# Patient Record
Sex: Male | Born: 1970
Health system: Southern US, Community
[De-identification: ages and names within clinical notes are randomized; demographics above are authoritative.]

## PROBLEM LIST (undated history)

## (undated) DIAGNOSIS — E119 Type 2 diabetes mellitus without complications: Secondary | ICD-10-CM

## (undated) DIAGNOSIS — I10 Essential (primary) hypertension: Secondary | ICD-10-CM

## (undated) DIAGNOSIS — R1013 Epigastric pain: Secondary | ICD-10-CM

## (undated) DIAGNOSIS — G4730 Sleep apnea, unspecified: Secondary | ICD-10-CM

## (undated) DIAGNOSIS — E785 Hyperlipidemia, unspecified: Secondary | ICD-10-CM

## (undated) DIAGNOSIS — E039 Hypothyroidism, unspecified: Secondary | ICD-10-CM

## (undated) DIAGNOSIS — M109 Gout, unspecified: Secondary | ICD-10-CM

## (undated) DIAGNOSIS — R51 Headache: Secondary | ICD-10-CM

## (undated) DIAGNOSIS — R519 Headache, unspecified: Secondary | ICD-10-CM

## (undated) DIAGNOSIS — K625 Hemorrhage of anus and rectum: Secondary | ICD-10-CM

## (undated) HISTORY — PX: UPPER GASTROINTESTINAL ENDOSCOPY: SHX188

## (undated) HISTORY — DX: Essential (primary) hypertension: I10

## (undated) HISTORY — DX: Hyperlipidemia, unspecified: E78.5

## (undated) HISTORY — PX: COLONOSCOPY: SHX174

## (undated) HISTORY — DX: Hypothyroidism, unspecified: E03.9

## (undated) HISTORY — DX: Hemorrhage of anus and rectum: K62.5

## (undated) HISTORY — DX: Headache, unspecified: R51.9

## (undated) HISTORY — DX: Gout, unspecified: M10.9

## (undated) HISTORY — PX: OTHER SURGICAL HISTORY: SHX169

## (undated) HISTORY — DX: Type 2 diabetes mellitus without complications: E11.9

## (undated) HISTORY — DX: Sleep apnea, unspecified: G47.30

## (undated) HISTORY — DX: Headache: R51

## (undated) HISTORY — DX: Epigastric pain: R10.13

---

## 2007-07-08 ENCOUNTER — Ambulatory Visit: Payer: Self-pay | Admitting: Cardiology

## 2007-07-26 ENCOUNTER — Ambulatory Visit: Payer: Self-pay

## 2007-08-12 ENCOUNTER — Ambulatory Visit (HOSPITAL_COMMUNITY): Admission: RE | Admit: 2007-08-12 | Discharge: 2007-08-12 | Payer: Self-pay | Admitting: Orthopaedic Surgery

## 2007-08-16 ENCOUNTER — Ambulatory Visit: Payer: Self-pay | Admitting: Cardiology

## 2007-08-18 ENCOUNTER — Ambulatory Visit (HOSPITAL_COMMUNITY): Admission: RE | Admit: 2007-08-18 | Discharge: 2007-08-18 | Payer: Self-pay | Admitting: Cardiology

## 2009-06-15 ENCOUNTER — Ambulatory Visit (HOSPITAL_COMMUNITY): Admission: RE | Admit: 2009-06-15 | Discharge: 2009-06-15 | Payer: Self-pay | Admitting: Orthopaedic Surgery

## 2011-05-12 NOTE — Assessment & Plan Note (Signed)
HEALTHCARE                            CARDIOLOGY OFFICE NOTE   NAME:[name], [name]                        MRN:          [id number]  DATE:07/08/2007                            DOB:          24-Apr-1971    CLINIC NOTE   I am asked to consult on Dr. Corliss [name] for an abnormal electrocardiogram.   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  Of note, he exercises occasionally for  approximately 30 minutes on an elliptical trainer.  He denies any  dyspnea on exertion, orthopnea, PND, pedal edema, palpitations, pre-  syncope, syncope, or exertional chest pain.  He was recently seen by Dr.  Waynard [name] for a routine physical examination.  At that time, he was noted  to have an abnormal electrocardiogram and cardiology was asked to  further evaluate.   MEDICATIONS:  Fish oil daily.  He also takes Maxalt, ibuprofen, and  Zyrtec as needed.   He has no known drug allergies.   SOCIAL HISTORY:  He does not smoke and only rarely consumes alcohol.   FAMILY HISTORY:  Negative for coronary artery disease or sudden death.   PAST MEDICAL HISTORY:  There is no diabetes mellitus.  He apparently has  had borderline hypertension in the past and he has hyperlipidemia (he  did bring laboratories today from March of 2007 that showed a total  cholesterol of 217, an HDL of 33, and an LDL of 152).  He has had  headaches since an early age.  He also has seasonal allergies.  He has  had no previous surgeries.   REVIEW OF SYSTEMS:  He does not have a headache at present.  There are  no fevers or chills.  No productive cough.  There is no hemoptysis.  He  does occasionally have allergies and a cough in the morning.  There is  no melena or hematochezia.  There is no dysuria or hematuria.  There is  no rash or seizure activity.  There is no orthopnea, PND, or pedal  edema.  The remaining systems are negative.   PHYSICAL EXAM:  Blood  pressure 142/98, pulse 64.  He weighs 166 pounds.  He is well-developed, well-nourished in no acute distress.  SKIN:  Warm and dry.  He does not appear to be depressed and there is no peripheral clubbing.  His back is normal.  HEENT:  Normal with normal eyelids.  NECK:  Supple with a normal upstroke bilaterally and there are no bruits  noted.  There is no jugular venous distension and no thyromegaly is  noted.  CHEST:  Clear to auscultation with normal expansion.  CARDIOVASCULAR:  Regular rate and rhythm with normal S1, S2.  There are  no murmurs, rubs, or gallops noted.  There is no change with Valsalva.  His PMI is not displaced.  ABDOMEN:  Nontender, nondistended.  Positive bowel sounds.  No  hepatosplenomegaly.  No mass appreciated.  There is no abdominal bruit.  He has 2+ femoral pulses bilaterally.  No bruits.  EXTREMITIES:  No edema and I  can palpate no cords.  He has 2+ posterior  tibial pulses bilaterally.  NEUROLOGIC:  Grossly intact.   I do have an electrocardiogram from [name] [name] office.  This shows a  normal sinus rhythm at a rate of 71.  The axis is normal.  There are  minor nonspecific ST changes in the inferior leads.  His intervals are  normal.   DIAGNOSES:  1. 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  He      does exercise occasionally and has no symptoms with exercise.  I      have reviewed the electrocardiogram and it shows minor nonspecific      changes.  Given his risk factors of hypertension and      hyperlipidemia, we will plan to proceed with an exercise treadmill.      If it is unremarkable, then we will not proceed with further      cardiac workup.  2. Hypertension - His blood pressure is elevated today and he states      it was elevated when he was seen previously by Dr. Waynard [name].  This      will need to be followed closely and medications will need to be       initiated if lifestyle modifications do not help with this,      including diet and exercise.  3. Hyperlipidemia - He is adjusting his diet.  He also states that Dr.      Waynard [name] has placed him on Crestor.  He will continue on his fish      oil.   We will see him back for his exercise treadmill on July 29.     [name] Frieze Jens Som, MD, [facility]  Electronically Signed    BSC/MedQ  DD: 07/08/2007  DT: 07/08/2007  Job #: [id number]

## 2011-09-17 ENCOUNTER — Other Ambulatory Visit: Payer: Self-pay | Admitting: Internal Medicine

## 2011-09-17 DIAGNOSIS — R1013 Epigastric pain: Secondary | ICD-10-CM

## 2011-09-29 ENCOUNTER — Ambulatory Visit
Admission: RE | Admit: 2011-09-29 | Discharge: 2011-09-29 | Disposition: A | Discharge status: Home / Self Care | Payer: BC Managed Care – PPO | Source: Ambulatory Visit | Attending: Internal Medicine | Admitting: Internal Medicine

## 2011-09-29 DIAGNOSIS — R1013 Epigastric pain: Secondary | ICD-10-CM

## 2011-12-16 ENCOUNTER — Other Ambulatory Visit (INDEPENDENT_AMBULATORY_CARE_PROVIDER_SITE_OTHER): Payer: Self-pay | Admitting: General Surgery

## 2011-12-16 DIAGNOSIS — D225 Melanocytic nevi of trunk: Secondary | ICD-10-CM

## 2017-09-27 DIAGNOSIS — B9681 Helicobacter pylori [H. pylori] as the cause of diseases classified elsewhere: Secondary | ICD-10-CM

## 2017-09-27 HISTORY — DX: Helicobacter pylori (H. pylori) as the cause of diseases classified elsewhere: B96.81

## 2017-10-18 ENCOUNTER — Telehealth: Payer: Self-pay | Admitting: Gastroenterology

## 2017-10-18 DIAGNOSIS — R109 Unspecified abdominal pain: Secondary | ICD-10-CM

## 2017-10-18 NOTE — Telephone Encounter (Signed)
[name] [name]) has had some gnawing epigastric pain for about a month and today passed 2 dark stools.  He otherwise feels fine.  No more fatigue than his usual.  He rarely takes nsaids.  He started zegeride tonight. Planning to take two pills tonight, then one pill BID.  Patty, He needs cbc, bmet tomorrow (tuesday).  CAn you also put him in [facility] Wednesday 9am spot for EGD.  He is expecting a call about the details tomorrow and said you can leave a message on VM and he'll call back if he can't answer right away.  His cell is [phone].  thanks

## 2017-10-19 ENCOUNTER — Other Ambulatory Visit (INDEPENDENT_AMBULATORY_CARE_PROVIDER_SITE_OTHER): Payer: Self-pay

## 2017-10-19 DIAGNOSIS — R109 Unspecified abdominal pain: Secondary | ICD-10-CM

## 2017-10-19 LAB — BASIC METABOLIC PANEL
BUN: 22 mg/dL (ref 6–23)
CO2: 35 mEq/L — ABNORMAL HIGH (ref 19–32)
Calcium: 9.5 mg/dL (ref 8.4–10.5)
Chloride: 97 mEq/L (ref 96–112)
Creatinine, Ser: 1.42 mg/dL (ref 0.40–1.50)
GFR: 56.55 mL/min — AB (ref >60.00)
GLUCOSE: 143 mg/dL — AB (ref 70–99)
POTASSIUM: 3.2 meq/L — AB (ref 3.5–5.1)
Sodium: 139 mEq/L (ref 135–145)

## 2017-10-19 LAB — CBC WITH DIFFERENTIAL/PLATELET
BASOS PCT: 0.5 % (ref 0.0–3.0)
Basophils Absolute: 0 10*3/uL (ref 0.0–0.1)
EOS ABS: 0.1 10*3/uL (ref 0.0–0.7)
EOS PCT: 0.9 % (ref 0.0–5.0)
HEMATOCRIT: 41.6 % (ref 39.0–52.0)
HEMOGLOBIN: 14.3 g/dL (ref 13.0–17.0)
LYMPHS PCT: 19.5 % (ref 12.0–46.0)
Lymphs Abs: 1.5 10*3/uL (ref 0.7–4.0)
MCHC: 34.3 g/dL (ref 30.0–36.0)
MCV: 86.1 fl (ref 78.0–100.0)
Monocytes Absolute: 0.4 10*3/uL (ref 0.1–1.0)
Monocytes Relative: 5 % (ref 3.0–12.0)
Neutro Abs: 5.8 10*3/uL (ref 1.4–7.7)
Neutrophils Relative %: 74.1 % (ref 43.0–77.0)
Platelets: 258 10*3/uL (ref 150.0–400.0)
RBC: 4.84 Mil/uL (ref 4.22–5.81)
RDW: 13.9 % (ref 11.5–15.5)
WBC: 7.8 10*3/uL (ref 4.0–10.5)

## 2017-10-19 NOTE — Telephone Encounter (Signed)
Left message on machine to call back to notify Dr Georgette Dover of the EGD and labs

## 2017-10-20 ENCOUNTER — Ambulatory Visit (AMBULATORY_SURGERY_CENTER): Payer: 59 | Admitting: Gastroenterology

## 2017-10-20 ENCOUNTER — Encounter: Payer: Self-pay | Admitting: Gastroenterology

## 2017-10-20 ENCOUNTER — Other Ambulatory Visit: Payer: Self-pay

## 2017-10-20 VITALS — BP 113/73 | HR 63 | Temp 96.2°F | Resp 16 | Ht 68.0 in | Wt 175.0 lb

## 2017-10-20 DIAGNOSIS — K921 Melena: Secondary | ICD-10-CM

## 2017-10-20 DIAGNOSIS — K2970 Gastritis, unspecified, without bleeding: Secondary | ICD-10-CM | POA: Diagnosis not present

## 2017-10-20 DIAGNOSIS — K2990 Gastroduodenitis, unspecified, without bleeding: Secondary | ICD-10-CM | POA: Diagnosis not present

## 2017-10-20 DIAGNOSIS — K317 Polyp of stomach and duodenum: Secondary | ICD-10-CM | POA: Diagnosis not present

## 2017-10-20 DIAGNOSIS — R1013 Epigastric pain: Secondary | ICD-10-CM

## 2017-10-20 DIAGNOSIS — B9681 Helicobacter pylori [H. pylori] as the cause of diseases classified elsewhere: Secondary | ICD-10-CM | POA: Diagnosis not present

## 2017-10-20 DIAGNOSIS — K2950 Unspecified chronic gastritis without bleeding: Secondary | ICD-10-CM | POA: Diagnosis not present

## 2017-10-20 MED ORDER — SODIUM CHLORIDE 0.9 % IV SOLN: 500.0000 mL | INTRAVENOUS | Status: DC

## 2017-10-20 MED ORDER — NA SULFATE-K SULFATE-MG SULF 17.5-3.13-1.6 GM/177ML PO SOLN: 1.0000 | Freq: Once | ORAL | 0 refills | Status: AC

## 2017-10-20 NOTE — Op Note (Signed)
[facility] [place] Patient Name: [name] [name] Procedure Date: 10/20/2017 8:42 AM MRN: [id number] Endoscopist: Milus Banister , MD Age: 46 Referring MD:  Date of Birth: [birth year] Gender: Male Account #: [id number] Procedure:                Upper GI endoscopy Indications:              Epigastric abdominal pain (2 months, gnawing), 2                            dark stools Medicines:                Monitored Anesthesia Care Procedure:                Pre-Anesthesia Assessment:                           - Prior to the procedure, a History and Physical                            was performed, and patient medications and                            allergies were reviewed. The patient's tolerance of                            previous anesthesia was also reviewed. The risks                            and benefits of the procedure and the sedation                            options and risks were discussed with the patient.                            All questions were answered, and informed consent                            was obtained. Prior Anticoagulants: The patient has                            taken no previous anticoagulant or antiplatelet                            agents. ASA Grade Assessment: III - A patient with                            severe systemic disease. After reviewing the risks                            and benefits, the patient was deemed in                            satisfactory condition to undergo the procedure.  After obtaining informed consent, the endoscope was                            passed under direct vision. Throughout the                            procedure, the patient's blood pressure, pulse, and                            oxygen saturations were monitored continuously. The                            Endoscope was introduced through the mouth, and                            advanced to the second part of duodenum.  The upper                            GI endoscopy was accomplished without difficulty.                            The patient tolerated the procedure well. Scope In: Scope Out: Findings:                 The esophagus was normal.                           Mild inflammation characterized by erythema was                            found in the gastric antrum. Biopsies were taken                            with a cold forceps for histology.                           A few small pedunculated polyps were found in the                            gastric body (fundic gland appearing). Biopsies                            were taken with a cold forceps for histology.                           The examined duodenum was normal. Complications:            No immediate complications. Estimated blood loss:                            None. Estimated Blood Loss:     Estimated blood loss: none. Impression:               - Normal esophagus.                           -  Mild distal gastritis, biopsied to check for H.                            pylori.                           - A few small gastric polyps that appeared to be                            fundic gland polyps. Biopsied.                           - Normal examined duodenum. Recommendation:           - Patient has a contact number available for                            emergencies. The signs and symptoms of potential                            delayed complications were discussed with the                            patient. Return to normal activities tomorrow.                            Written discharge instructions were provided to the                            patient.                           - Resume previous diet.                           - Continue present medications.                           - Await pathology results (will treat with                            appropriate antibiotics if positive for H. pylori).                            - Abdominal US for epigastric pain (office to                            order).                           - Colonoscopy in near future. Milus Banister, MD 10/20/2017 8:58:27 AM This report has been signed electronically.

## 2017-10-20 NOTE — Progress Notes (Signed)
No egg or soy allergy known to patient  No diet pills per patient No home 02 use per patient  No blood thinners per patient  Pt denies issues with constipation  No A fib or A flutter

## 2017-10-20 NOTE — Progress Notes (Signed)
A and O x3. Report to RN. Tolerated MAC anesthesia well.Teeth unchanged after procedure.

## 2017-10-20 NOTE — Telephone Encounter (Signed)
Pt scheduled for colon 10/29/17@7 :30am, pt to arrive here at 7am. Pt instructions sent to pt and prep sent to pharmacy.

## 2017-10-20 NOTE — Patient Instructions (Addendum)
**   Handout given on Gastritis ** Abdominal Ultrasound scheduled for 10/26  at 7am; be there at 645am. Ultrasound will be at [facility]. NPO after midnight.  YOU HAD AN ENDOSCOPIC PROCEDURE TODAY AT [facility]:   Refer to the procedure report that was given to you for any specific questions about what was found during the examination.  If the procedure report does not answer your questions, please call your gastroenterologist to clarify.  If you requested that your care partner not be given the details of your procedure findings, then the procedure report has been included in a sealed envelope for you to review at your convenience later.  YOU SHOULD EXPECT: Some feelings of bloating in the abdomen. Passage of more gas than usual.  Walking can help get rid of the air that was put into your GI tract during the procedure and reduce the bloating. If you had a lower endoscopy (such as a colonoscopy or flexible sigmoidoscopy) you may notice spotting of blood in your stool or on the toilet paper. If you underwent a bowel prep for your procedure, you may not have a normal bowel movement for a few days.  Please Note:  You might notice some irritation and congestion in your nose or some drainage.  This is from the oxygen used during your procedure.  There is no need for concern and it should clear up in a day or so.  SYMPTOMS TO REPORT IMMEDIATELY:  Following upper endoscopy (EGD)  Vomiting of blood or coffee ground material  New chest pain or pain under the shoulder blades  Painful or persistently difficult swallowing  New shortness of breath  Fever of 100F or higher  Black, tarry-looking stools  For urgent or emergent issues, a gastroenterologist can be reached at any hour by calling [phone].   DIET:  We do recommend a small meal at first, but then you may proceed to your regular diet.  Drink plenty of fluids but you should avoid alcoholic beverages for 24 hours.  ACTIVITY:   You should plan to take it easy for the rest of today and you should NOT DRIVE or use heavy machinery until tomorrow (because of the sedation medicines used during the test).    FOLLOW UP: Our staff will call the number listed on your records the next business day following your procedure to check on you and address any questions or concerns that you may have regarding the information given to you following your procedure. If we do not reach you, we will leave a message.  However, if you are feeling well and you are not experiencing any problems, there is no need to return our call.  We will assume that you have returned to your regular daily activities without incident.  If any biopsies were taken you will be contacted by phone or by letter within the next 1-3 weeks.  Please call us at [phone] if you have not heard about the biopsies in 3 weeks.    SIGNATURES/CONFIDENTIALITY: You and/or your care partner have signed paperwork which will be entered into your electronic medical record.  These signatures attest to the fact that that the information above on your After Visit Summary has been reviewed and is understood.  Full responsibility of the confidentiality of this discharge information lies with you and/or your care-partner.

## 2017-10-20 NOTE — Progress Notes (Signed)
Called to room to assist during endoscopic procedure.  Patient ID and intended procedure confirmed with present staff. Received instructions for my participation in the procedure from the performing physician.  

## 2017-10-20 NOTE — Telephone Encounter (Signed)
He's going to need a colonoscopy next week. November 2, Friday.  LEC 7:30 spot.  For blood in stool. Can you contact him again to go over the details, prep, etc. Thanks

## 2017-10-21 ENCOUNTER — Telehealth: Payer: Self-pay | Admitting: *Deleted

## 2017-10-21 ENCOUNTER — Encounter: Payer: Self-pay | Admitting: Gastroenterology

## 2017-10-21 NOTE — Telephone Encounter (Signed)
  Follow up Call-  Call back number 10/20/2017  Post procedure Call Back phone  # [phone]  Permission to leave phone message Yes  Some recent data might be hidden     Patient questions:  Do you have a fever, pain , or abdominal swelling? No. Pain Score  0 *  Have you tolerated food without any problems? Yes.    Have you been able to return to your normal activities? Yes.    Do you have any questions about your discharge instructions: Diet   No. Medications  No. Follow up visit  No.  Do you have questions or concerns about your Care? No.  Actions: * If pain score is 4 or above: No action needed, pain <4.

## 2017-10-22 ENCOUNTER — Other Ambulatory Visit: Payer: Self-pay

## 2017-10-22 ENCOUNTER — Ambulatory Visit (HOSPITAL_COMMUNITY)
Admission: RE | Admit: 2017-10-22 | Discharge: 2017-10-22 | Disposition: A | Discharge status: Home / Self Care | Payer: 59 | Source: Ambulatory Visit | Attending: Gastroenterology | Admitting: Gastroenterology

## 2017-10-22 DIAGNOSIS — R1013 Epigastric pain: Secondary | ICD-10-CM | POA: Diagnosis present

## 2017-10-22 DIAGNOSIS — R109 Unspecified abdominal pain: Secondary | ICD-10-CM

## 2017-10-22 DIAGNOSIS — K760 Fatty (change of) liver, not elsewhere classified: Secondary | ICD-10-CM | POA: Diagnosis not present

## 2017-10-25 ENCOUNTER — Other Ambulatory Visit: Payer: Self-pay

## 2017-10-25 ENCOUNTER — Encounter: Payer: Self-pay | Admitting: Gastroenterology

## 2017-10-25 MED ORDER — BIS SUBCIT-METRONID-TETRACYC 140-125-125 MG PO CAPS: 3.0000 | ORAL_CAPSULE | Freq: Three times a day (TID) | ORAL | 0 refills | Status: DC

## 2017-10-26 ENCOUNTER — Ambulatory Visit (INDEPENDENT_AMBULATORY_CARE_PROVIDER_SITE_OTHER)
Admission: RE | Admit: 2017-10-26 | Discharge: 2017-10-26 | Disposition: A | Discharge status: Home / Self Care | Payer: 59 | Source: Ambulatory Visit | Attending: Gastroenterology | Admitting: Gastroenterology

## 2017-10-26 DIAGNOSIS — R109 Unspecified abdominal pain: Secondary | ICD-10-CM

## 2017-10-26 MED ORDER — IOPAMIDOL (ISOVUE-300) INJECTION 61%: 100.0000 mL | Freq: Once | INTRAVENOUS | Status: DC | PRN

## 2017-10-29 ENCOUNTER — Encounter: Payer: Self-pay | Admitting: Gastroenterology

## 2017-10-29 ENCOUNTER — Ambulatory Visit (AMBULATORY_SURGERY_CENTER): Payer: 59 | Admitting: Gastroenterology

## 2017-10-29 VITALS — BP 109/62 | HR 68 | Temp 96.8°F | Resp 18 | Ht 68.0 in | Wt 175.0 lb

## 2017-10-29 DIAGNOSIS — K921 Melena: Secondary | ICD-10-CM

## 2017-10-29 DIAGNOSIS — D125 Benign neoplasm of sigmoid colon: Secondary | ICD-10-CM

## 2017-10-29 DIAGNOSIS — Z860101 Personal history of adenomatous and serrated colon polyps: Secondary | ICD-10-CM

## 2017-10-29 DIAGNOSIS — Z86010 Personal history of colonic polyps: Secondary | ICD-10-CM

## 2017-10-29 HISTORY — DX: Personal history of adenomatous and serrated colon polyps: Z86.0101

## 2017-10-29 MED ORDER — SODIUM CHLORIDE 0.9 % IV SOLN: 500.0000 mL | INTRAVENOUS | Status: DC

## 2017-10-29 NOTE — Patient Instructions (Signed)
YOU HAD AN ENDOSCOPIC PROCEDURE TODAY AT [facility]:   Refer to the procedure report that was given to you for any specific questions about what was found during the examination.  If the procedure report does not answer your questions, please call your gastroenterologist to clarify.  If you requested that your care partner not be given the details of your procedure findings, then the procedure report has been included in a sealed envelope for you to review at your convenience later.  YOU SHOULD EXPECT: Some feelings of bloating in the abdomen. Passage of more gas than usual.  Walking can help get rid of the air that was put into your GI tract during the procedure and reduce the bloating. If you had a lower endoscopy (such as a colonoscopy or flexible sigmoidoscopy) you may notice spotting of blood in your stool or on the toilet paper. If you underwent a bowel prep for your procedure, you may not have a normal bowel movement for a few days.  Please Note:  You might notice some irritation and congestion in your nose or some drainage.  This is from the oxygen used during your procedure.  There is no need for concern and it should clear up in a day or so.  SYMPTOMS TO REPORT IMMEDIATELY:   Following lower endoscopy (colonoscopy or flexible sigmoidoscopy):  Excessive amounts of blood in the stool  Significant tenderness or worsening of abdominal pains  Swelling of the abdomen that is new, acute  Fever of 100F or higher  For urgent or emergent issues, a gastroenterologist can be reached at any hour by calling [phone].   DIET:  We do recommend a small meal at first, but then you may proceed to your regular diet.  Drink plenty of fluids but you should avoid alcoholic beverages for 24 hours.  ACTIVITY:  You should plan to take it easy for the rest of today and you should NOT DRIVE or use heavy machinery until tomorrow (because of the sedation medicines used during the test).     FOLLOW UP: Our staff will call the number listed on your records the next business day following your procedure to check on you and address any questions or concerns that you may have regarding the information given to you following your procedure. If we do not reach you, we will leave a message.  However, if you are feeling well and you are not experiencing any problems, there is no need to return our call.  We will assume that you have returned to your regular daily activities without incident.  If any biopsies were taken you will be contacted by phone or by letter within the next 1-3 weeks.  Please call us at [phone] if you have not heard about the biopsies in 3 weeks.   Continue present medications, office will call to schedule a follow up appointment.   SIGNATURES/CONFIDENTIALITY: You and/or your care partner have signed paperwork which will be entered into your electronic medical record.  These signatures attest to the fact that that the information above on your After Visit Summary has been reviewed and is understood.  Full responsibility of the confidentiality of this discharge information lies with you and/or your care-partner.

## 2017-10-29 NOTE — Progress Notes (Signed)
Called to room to assist during endoscopic procedure.  Patient ID and intended procedure confirmed with present staff. Received instructions for my participation in the procedure from the performing physician.  

## 2017-10-29 NOTE — Op Note (Signed)
[place] Patient Name: [name] [name] Procedure Date: 10/29/2017 7:24 AM MRN: [id number] Endoscopist: Milus Banister , MD Age: 46 Referring MD:  Date of Birth: 1971/10/14 Gender: Male Account #: [id number] Procedure:                Colonoscopy Indications:              Blood in stool Medicines:                Monitored Anesthesia Care Procedure:                Pre-Anesthesia Assessment:                           - Prior to the procedure, a History and Physical                            was performed, and patient medications and                            allergies were reviewed. The patient's tolerance of                            previous anesthesia was also reviewed. The risks                            and benefits of the procedure and the sedation                            options and risks were discussed with the patient.                            All questions were answered, and informed consent                            was obtained. Prior Anticoagulants: The patient has                            taken no previous anticoagulant or antiplatelet                            agents. ASA Grade Assessment: II - A patient with                            mild systemic disease. After reviewing the risks                            and benefits, the patient was deemed in                            satisfactory condition to undergo the procedure.                           After obtaining informed consent, the colonoscope  was passed under direct vision. Throughout the                            procedure, the patient's blood pressure, pulse, and                            oxygen saturations were monitored continuously. The                            Colonoscope was introduced through the anus and                            advanced to the the cecum, identified by                            appendiceal orifice and ileocecal valve. The                   colonoscopy was performed without difficulty. The                            patient tolerated the procedure well. The quality                            of the bowel preparation was excellent. The                            ileocecal valve, appendiceal orifice, and rectum                            were photographed. Scope In: 7:28:56 AM Scope Out: 7:42:23 AM Scope Withdrawal Time: 0 hours 11 minutes 29 seconds  Total Procedure Duration: 0 hours 13 minutes 27 seconds  Findings:                 A 3 mm polyp was found in the sigmoid colon. The                            polyp was sessile. The polyp was removed with a                            cold snare. Resection and retrieval were complete.                           The exam was otherwise without abnormality on                            direct and retroflexion views. Complications:            No immediate complications. Estimated blood loss:                            None. Estimated Blood Loss:     Estimated blood loss: none. Impression:               - One 3 mm polyp in the sigmoid colon,  removed with                            a cold snare. Resected and retrieved.                           - The examination was otherwise normal on direct                            and retroflexion views. Recommendation:           - Patient has a contact number available for                            emergencies. The signs and symptoms of potential                            delayed complications were discussed with the                            patient. Return to normal activities tomorrow.                            Written discharge instructions were provided to the                            patient.                           - Resume previous diet.                           - Continue present medications. Continue twice                            daily PPI for one month following your pylera                             treatment, then decrease to once daily for 1 month                            then taper off. Dr. Ardis Hughs' office will contact you                            in 2-3 months to arrange H. pylori eradication                            testing (stool Ag test)                           You will receive a letter within 2-3 weeks with the                            pathology results and my final recommendations.  If the polyp(s) is proven to be 'pre-cancerous' on                            pathology, you will need repeat colonoscopy in 5                            years. If the polyp(s) is NOT 'precancerous' on                            pathology then you should repeat colon cancer                            screening in 10 years with colonoscopy without need                            for colon cancer screening by any method prior to                            then (including stool testing). Milus Banister, MD 10/29/2017 7:46:45 AM This report has been signed electronically.

## 2017-10-29 NOTE — Progress Notes (Signed)
No change in medical or surgical hx since last visit to the [facility]

## 2017-10-29 NOTE — Progress Notes (Signed)
A and O x3. Report to RN. Tolerated MAC anesthesia well.

## 2017-11-01 ENCOUNTER — Telehealth: Payer: Self-pay

## 2017-11-01 NOTE — Telephone Encounter (Signed)
  Follow up Call-  Call back number 10/29/2017 10/20/2017  Post procedure Call Back phone  # [phone]  Permission to leave phone message Yes Yes  Some recent data might be hidden     Patient questions:  Do you have a fever, pain , or abdominal swelling? No. Pain Score  0 *  Have you tolerated food without any problems? Yes.    Have you been able to return to your normal activities? Yes.    Do you have any questions about your discharge instructions: Diet   No. Medications  No. Follow up visit  No.  Do you have questions or concerns about your Care? No.  Actions: * If pain score is 4 or above: No action needed, pain <4.

## 2017-11-02 ENCOUNTER — Other Ambulatory Visit: Payer: Self-pay

## 2017-11-02 DIAGNOSIS — A048 Other specified bacterial intestinal infections: Secondary | ICD-10-CM

## 2017-11-04 ENCOUNTER — Encounter: Payer: Self-pay | Admitting: Gastroenterology

## 2018-08-01 IMAGING — CT CT ABD-PELV W/ CM
2 of 5 series · 16 of 46 positions shown, 18 images · IV contrast (ISOVUE 300)
Comparison: None.

CLINICAL DATA: Epigastric abdominal pain with nausea and vomiting
for 3 months. Rectal bleeding and bloody stools.

EXAM:
CT ABDOMEN AND PELVIS WITH CONTRAST
TECHNIQUE: Multidetector CT imaging of the abdomen and pelvis was performed
using the standard protocol following bolus administration of
intravenous contrast.
CONTRAST:  100 cc Nsovue-KLL

[Series 2: abd/pel w · axial · 0.84mm/px · z∈[+774,+1204]mm · 13 of 96 slices shown, 15 images]
[im 5/96  soft-tissue]
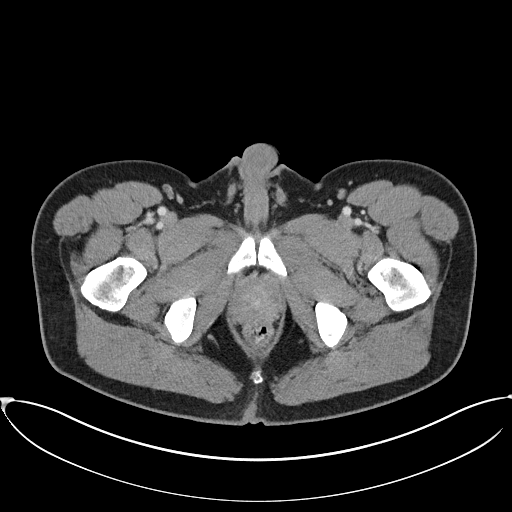
[im 5/96  bone]
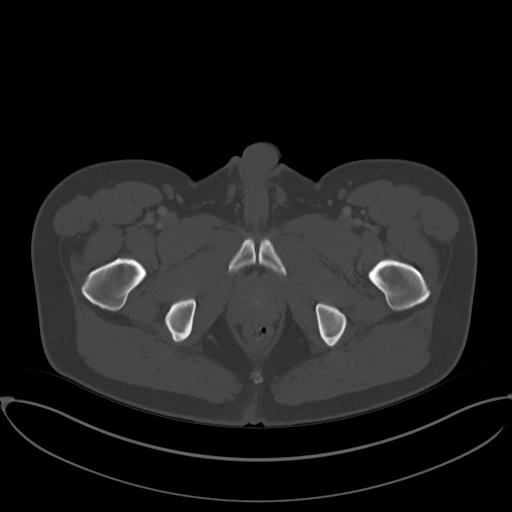
[im 15/96  soft-tissue]
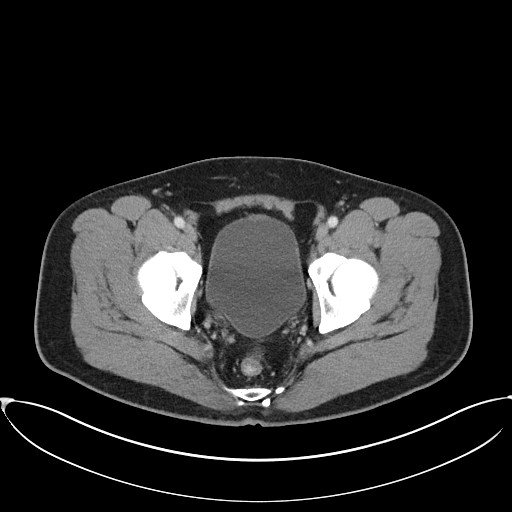
[im 20/96  soft-tissue]
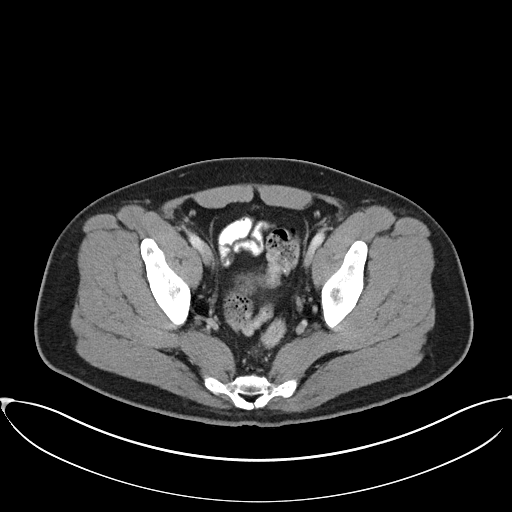
[im 29/96  soft-tissue]
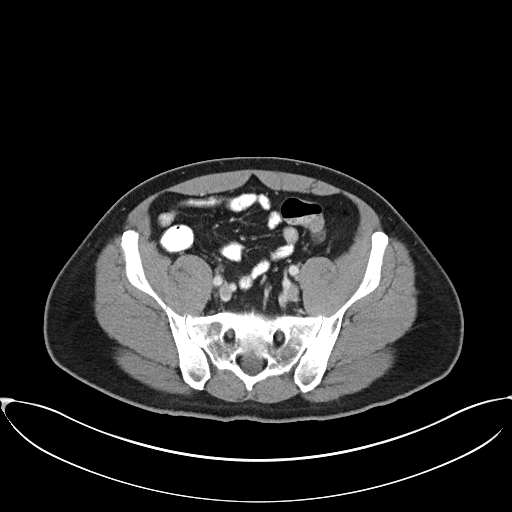
[im 34/96  soft-tissue]
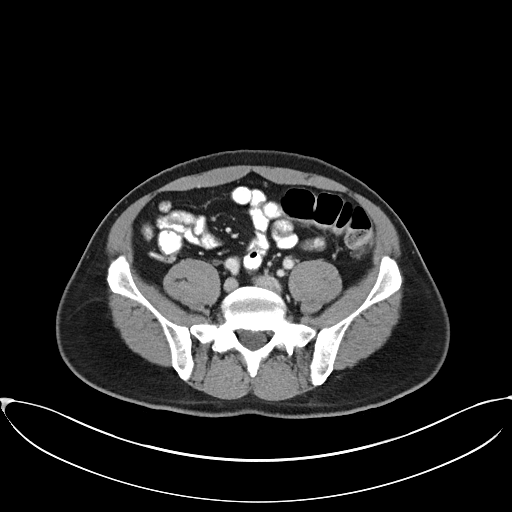
[im 43/96  soft-tissue]
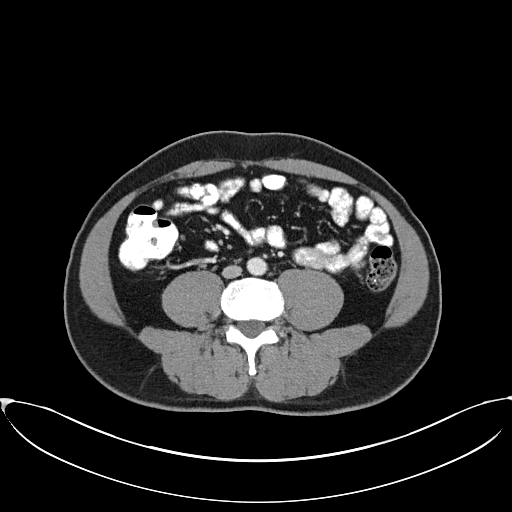
[im 48/96  soft-tissue]
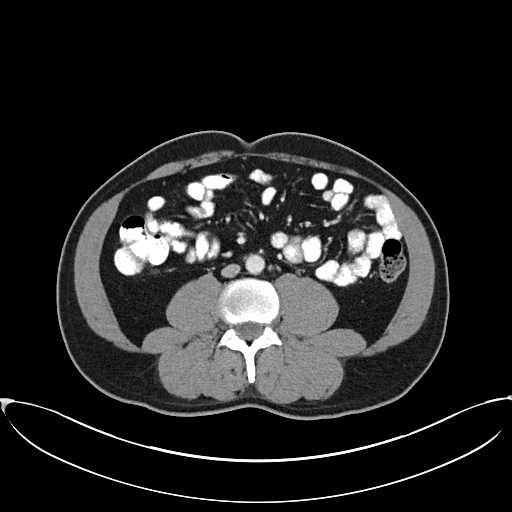
[im 53/96  soft-tissue]
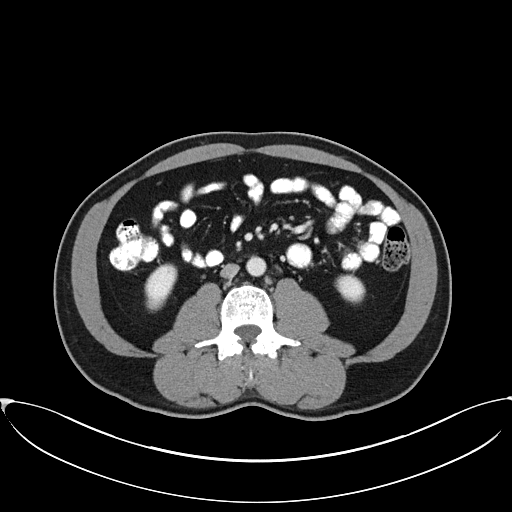
[im 62/96  soft-tissue]
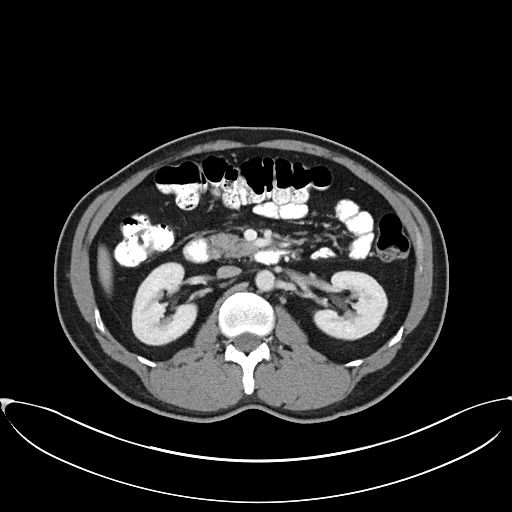
[im 62/96  bone]
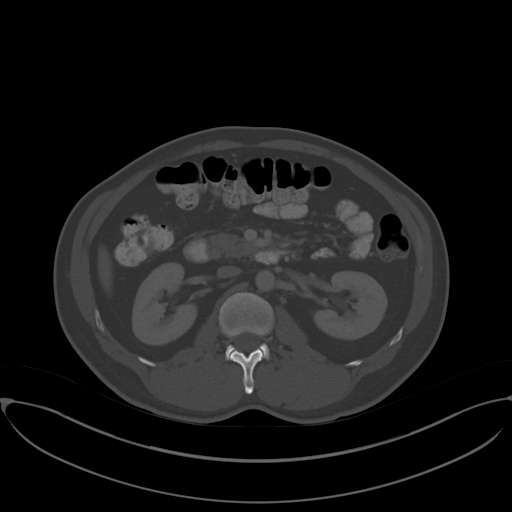
[im 67/96  soft-tissue]
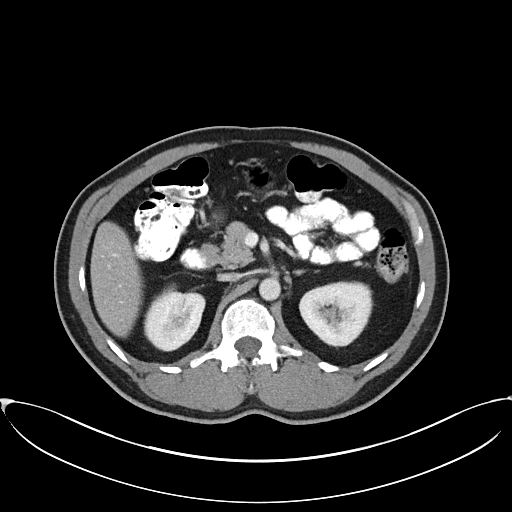
[im 77/96  soft-tissue]
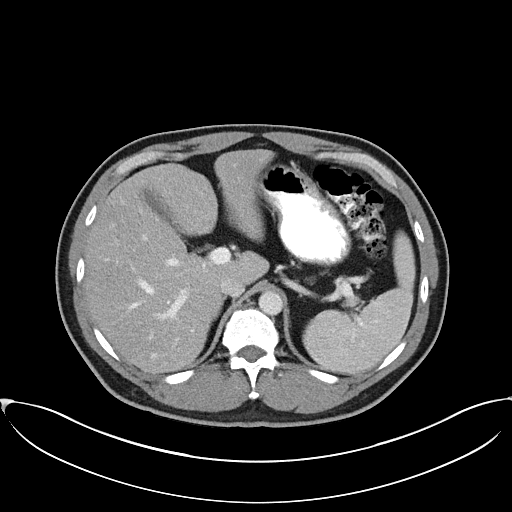
[im 81/96  soft-tissue]
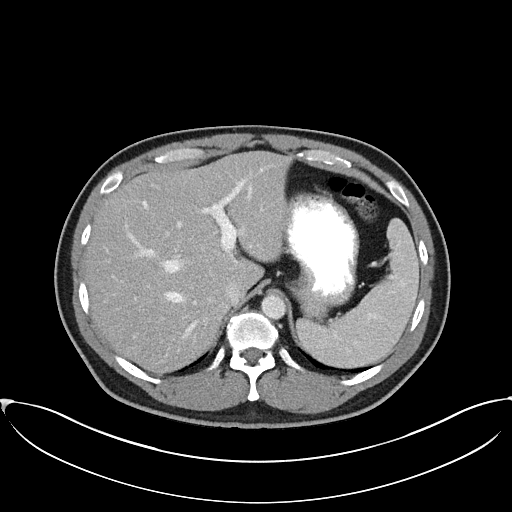
[im 91/96  soft-tissue]
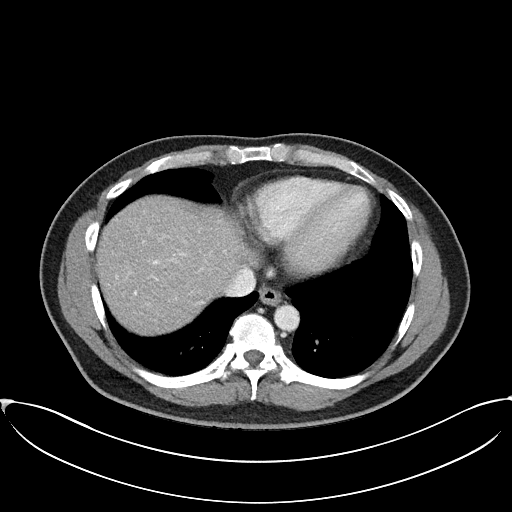

[Series 5: abd/pel w st · coronal · 0.82mm/px · 3 of 84 slices shown]
[im 28/84  soft-tissue]
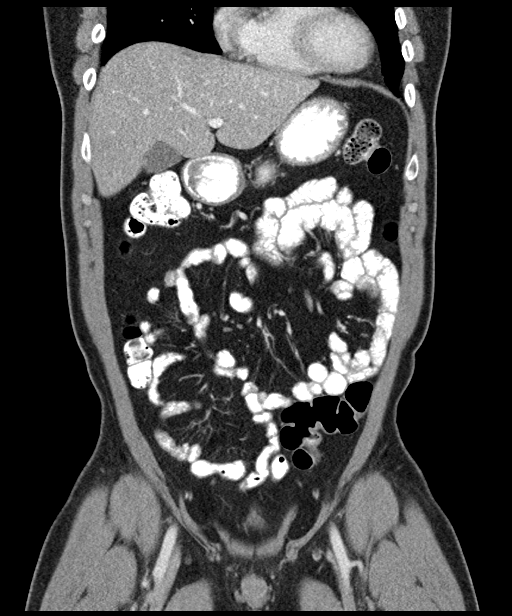
[im 37/84  soft-tissue]
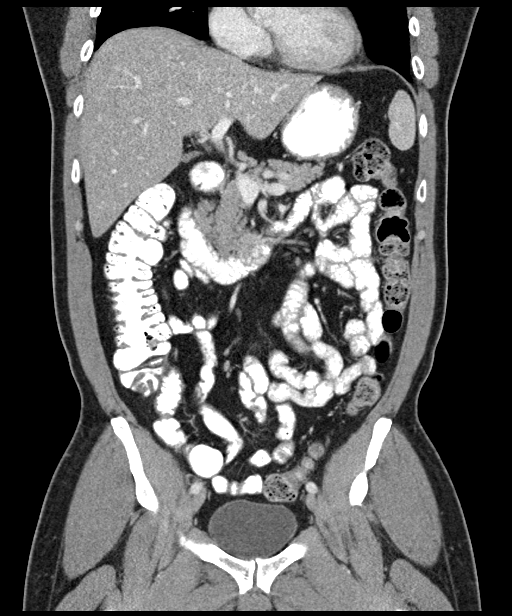
[im 47/84  soft-tissue]
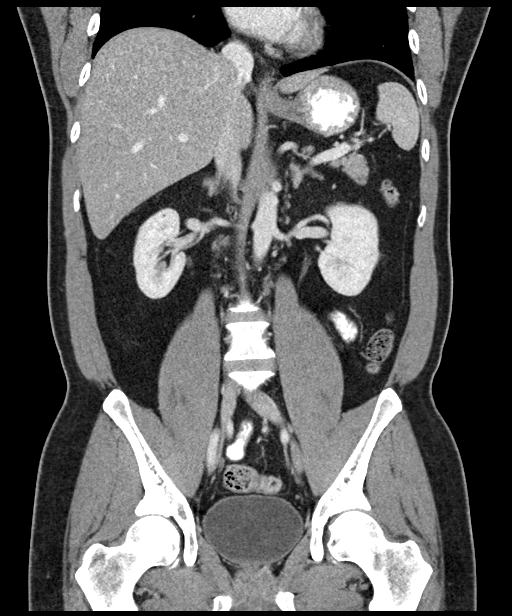

[16 of 46 positions shown; findings below may reference images not displayed]

FINDINGS: Lower chest: The lung bases are clear of acute process. No pleural
effusion or pulmonary lesions. The heart is normal in size. No
pericardial effusion. The distal esophagus and aorta are
unremarkable. No hiatal hernia is demonstrated.

Hepatobiliary: Mild diffuse fatty infiltration of the liver but no
focal hepatic lesions or intrahepatic biliary dilatation. The
gallbladder appears normal. No common bile duct dilatation.

Pancreas: No mass, inflammation or ductal dilatation.

Spleen: Normal size.  No focal lesions.

Adrenals/Urinary Tract: The adrenal glands and kidneys are normal.
No renal, ureteral or bladder calculi or mass. The delayed images do
not demonstrate any collecting system abnormalities.

Stomach/Bowel: The stomach, duodenum, small bowel and colon are
unremarkable. No acute inflammatory changes, mass lesions or
obstructive findings. The terminal ileum is normal. The appendix is
normal. No diverticular disease.

Vascular/Lymphatic: The aorta and branch vessels are normal. No
atherosclerotic calcifications, aneurysm or dissection. The major
venous structures are patent. No mesenteric or retroperitoneal mass
or lymphadenopathy. Small scattered lymph nodes are within normal
limits. No pelvic lymphadenopathy. No inguinal lymphadenopathy.

Reproductive: The prostate gland and seminal vesicles are
unremarkable.

Other: No pelvic mass or adenopathy. No inguinal mass or hernia. No
abdominal wall hernia or subcutaneous lesions.

Musculoskeletal: No significant bony findings.
IMPRESSION: 1. Mild diffuse fatty infiltration of the liver but no hepatic
lesions or biliary dilatation.
2. No acute abdominal/pelvic findings, mass lesions or adenopathy.

## 2018-10-03 DIAGNOSIS — R030 Elevated blood-pressure reading, without diagnosis of hypertension: Secondary | ICD-10-CM | POA: Diagnosis not present

## 2018-10-03 DIAGNOSIS — E7849 Other hyperlipidemia: Secondary | ICD-10-CM | POA: Diagnosis not present

## 2018-10-03 DIAGNOSIS — R7301 Impaired fasting glucose: Secondary | ICD-10-CM | POA: Diagnosis not present

## 2018-10-03 DIAGNOSIS — E038 Other specified hypothyroidism: Secondary | ICD-10-CM | POA: Diagnosis not present

## 2018-11-28 DIAGNOSIS — H5213 Myopia, bilateral: Secondary | ICD-10-CM | POA: Diagnosis not present

## 2018-11-28 DIAGNOSIS — E119 Type 2 diabetes mellitus without complications: Secondary | ICD-10-CM | POA: Diagnosis not present

## 2018-11-28 DIAGNOSIS — H52203 Unspecified astigmatism, bilateral: Secondary | ICD-10-CM | POA: Diagnosis not present

## 2018-11-28 DIAGNOSIS — H524 Presbyopia: Secondary | ICD-10-CM | POA: Diagnosis not present

## 2019-04-14 DIAGNOSIS — E039 Hypothyroidism, unspecified: Secondary | ICD-10-CM | POA: Diagnosis not present

## 2019-04-14 DIAGNOSIS — E7800 Pure hypercholesterolemia, unspecified: Secondary | ICD-10-CM | POA: Diagnosis not present

## 2019-04-14 DIAGNOSIS — E291 Testicular hypofunction: Secondary | ICD-10-CM | POA: Diagnosis not present

## 2019-04-14 DIAGNOSIS — R7301 Impaired fasting glucose: Secondary | ICD-10-CM | POA: Diagnosis not present

## 2019-04-19 DIAGNOSIS — E785 Hyperlipidemia, unspecified: Secondary | ICD-10-CM | POA: Diagnosis not present

## 2019-04-19 DIAGNOSIS — E7849 Other hyperlipidemia: Secondary | ICD-10-CM | POA: Diagnosis not present

## 2019-04-19 DIAGNOSIS — R7301 Impaired fasting glucose: Secondary | ICD-10-CM | POA: Diagnosis not present

## 2019-04-19 DIAGNOSIS — Z0000 Encounter for general adult medical examination without abnormal findings: Secondary | ICD-10-CM | POA: Diagnosis not present

## 2019-04-19 DIAGNOSIS — Z125 Encounter for screening for malignant neoplasm of prostate: Secondary | ICD-10-CM | POA: Diagnosis not present

## 2019-04-19 DIAGNOSIS — M109 Gout, unspecified: Secondary | ICD-10-CM | POA: Diagnosis not present

## 2019-04-19 DIAGNOSIS — E038 Other specified hypothyroidism: Secondary | ICD-10-CM | POA: Diagnosis not present

## 2019-04-25 DIAGNOSIS — R7301 Impaired fasting glucose: Secondary | ICD-10-CM | POA: Diagnosis not present

## 2019-04-25 DIAGNOSIS — E7800 Pure hypercholesterolemia, unspecified: Secondary | ICD-10-CM | POA: Diagnosis not present

## 2019-04-25 DIAGNOSIS — E039 Hypothyroidism, unspecified: Secondary | ICD-10-CM | POA: Diagnosis not present

## 2019-04-25 DIAGNOSIS — E291 Testicular hypofunction: Secondary | ICD-10-CM | POA: Diagnosis not present

## 2019-04-26 DIAGNOSIS — E039 Hypothyroidism, unspecified: Secondary | ICD-10-CM | POA: Diagnosis not present

## 2019-04-26 DIAGNOSIS — M109 Gout, unspecified: Secondary | ICD-10-CM | POA: Diagnosis not present

## 2019-04-26 DIAGNOSIS — D126 Benign neoplasm of colon, unspecified: Secondary | ICD-10-CM | POA: Diagnosis not present

## 2019-04-26 DIAGNOSIS — R5383 Other fatigue: Secondary | ICD-10-CM | POA: Diagnosis not present

## 2019-04-26 DIAGNOSIS — Z0000 Encounter for general adult medical examination without abnormal findings: Secondary | ICD-10-CM | POA: Diagnosis not present

## 2019-04-26 DIAGNOSIS — Z1331 Encounter for screening for depression: Secondary | ICD-10-CM | POA: Diagnosis not present

## 2019-04-26 DIAGNOSIS — Z111 Encounter for screening for respiratory tuberculosis: Secondary | ICD-10-CM | POA: Diagnosis not present

## 2019-04-27 ENCOUNTER — Telehealth: Payer: Self-pay | Admitting: Neurology

## 2019-04-27 NOTE — Telephone Encounter (Signed)
Due to current COVID 19 pandemic, our office is severely reducing in office visits, in order to minimize the risk to our patients and healthcare providers.    Pt understands that although there may be some limitations with this type of visit, we will take all precautions to reduce any security or privacy concerns.  Pt understands that this will be treated like an in office visit and we will file with pt's insurance, and there may be a patient responsible charge related to this service.  Pt's email is [email or web address]. Pt understands that the nurse will be calling to go over pt's chart.

## 2019-05-02 ENCOUNTER — Encounter: Payer: Self-pay | Admitting: Neurology

## 2019-05-02 NOTE — Addendum Note (Signed)
Addended by: Darleen Crocker on: 05/02/2019 02:08 PM   Modules accepted: Orders

## 2019-05-02 NOTE — Telephone Encounter (Signed)
Called the patient to review their chart and made sure that everything was up to date. There was no answer, LVM for the patient and advised I would also send a message on mychart if it was easier to respond to that.

## 2019-05-04 ENCOUNTER — Other Ambulatory Visit: Payer: Self-pay

## 2019-05-04 ENCOUNTER — Encounter: Payer: Self-pay | Admitting: Neurology

## 2019-05-04 ENCOUNTER — Ambulatory Visit (INDEPENDENT_AMBULATORY_CARE_PROVIDER_SITE_OTHER): Payer: BLUE CROSS/BLUE SHIELD | Admitting: Neurology

## 2019-05-04 DIAGNOSIS — I10 Essential (primary) hypertension: Secondary | ICD-10-CM | POA: Diagnosis not present

## 2019-05-04 DIAGNOSIS — M109 Gout, unspecified: Secondary | ICD-10-CM

## 2019-05-04 DIAGNOSIS — E119 Type 2 diabetes mellitus without complications: Secondary | ICD-10-CM | POA: Diagnosis not present

## 2019-05-04 DIAGNOSIS — R0683 Snoring: Secondary | ICD-10-CM

## 2019-05-04 DIAGNOSIS — G4719 Other hypersomnia: Secondary | ICD-10-CM | POA: Diagnosis not present
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
## 2019-05-04 NOTE — Patient Instructions (Signed)

## 2019-06-12 ENCOUNTER — Ambulatory Visit (INDEPENDENT_AMBULATORY_CARE_PROVIDER_SITE_OTHER): Payer: BC Managed Care – PPO | Admitting: Neurology

## 2019-06-12 DIAGNOSIS — E119 Type 2 diabetes mellitus without complications: Secondary | ICD-10-CM

## 2019-06-12 DIAGNOSIS — G4733 Obstructive sleep apnea (adult) (pediatric): Secondary | ICD-10-CM

## 2019-06-12 DIAGNOSIS — G4719 Other hypersomnia: Secondary | ICD-10-CM

## 2019-06-12 DIAGNOSIS — M109 Gout, unspecified: Secondary | ICD-10-CM

## 2019-06-12 DIAGNOSIS — I10 Essential (primary) hypertension: Secondary | ICD-10-CM

## 2019-06-12 DIAGNOSIS — R0683 Snoring: Secondary | ICD-10-CM

## 2019-06-16 NOTE — Procedures (Signed)
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 Joylene Draft for evaluation of possible OSA. Excessive daytime sleepiness in a patient with Gout, DM /prediabetes, HTN, Hyperlipidemia. Family medical and sleep history: eldest brother of 2 has OSA and uses CPAP.             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 His AHI ( apnea- hypopnea index ) reached 33.7/h and in REM sleep AHI reached 39/h. This is considered a severe degree of OSA.  There was no prolonged hypoxia noted and only moderate snoring was documented. No tachy-bradycardia was seen.   Recommendations:     I prefer CPAP autotitration for the treatment of severe Obstructive Sleep Apnea. Plan B can be a dental device or the Inspire device. My order for an autotitration capable CPAP machine will be send to a DME of Dr. Vonna Kotyk choice. The pressure window will be 6-18 cm water with 3 cm EPR, heated humidity and interface of the patient's choice.  Follow up with me, Dr. Brett Fairy, in 3 month.          Sleep Summary  Oxygen Saturation Statistics   Start Study Time: End Study Time: Total Recording Time:  10:59:26PM 5:51:44 AM 6 h, 63min  Total Sleep Time % REM of Sleep Time:  5 h, 39 min 21.8    Mean: 94 Minimum: 85 Maximum: 99  Mean of Desaturations Nadirs (%):   91  Oxygen Desaturation %:   4-9 10-20 >20 Total  Events Number Total   116  7 94.3 5.7  0 0.0  123 100.0  Oxygen Saturation: <90 <=88 <85 <80 <70  Duration (minutes): Sleep % 5.2 1.5  2.8 0.0  0.8 0.0 0.0 0.0 0.0 0.0     Respiratory Indices      Total Events REM NREM All Night  pRDI:  203  pAHI:  190 ODI:  123  pAHIc:  4  % CSR: 0.0 41.5 39.9 32.6 0.8 34.5 32.0 18.8 0.7 36.0 33.7 21.8 0.7       Pulse Rate Statistics during Sleep (BPM)      Mean:  60 Minimum: 53 Maximum: 93         Indices are calculated using technically valid sleep time of  5 hrs, 38 min. pRDI/pAHI are calculated using oxi desaturations ? 3% Sit N/A Body Position Statistics  Position Supine Prone Right Left Non-Supine  Sleep (min) 339.3 0.0 0.5 0.0 0.5  Sleep % 99.9 0.0 0.1 0.0 0.1  pRDI 36.0 N/A N/A N/A N/A  pAHI 33.7 N/A N/A N/A N/A  ODI 21.8 N/A N/A N/A N/A     Snoring Statistics Snoring Level (dB) >40 >50 >60 >70 >80 >Threshold (45)  Sleep (min) 234.9 15.9 2.3 0.0 0.0 64.6  Sleep % 69.1 4.7 0.7 0.0 0.0 19.0    Mean: 43 dB Sleep Stages Chart  pAHI=33.7                                                               Mild              Moderate                    Severe

## 2019-06-16 NOTE — Addendum Note (Signed)
Addended by: Larey Seat on: 06/16/2019 03:03 PM   Modules accepted: Orders

## 2019-06-19 ENCOUNTER — Encounter: Payer: Self-pay | Admitting: Neurology

## 2019-07-03 DIAGNOSIS — G4733 Obstructive sleep apnea (adult) (pediatric): Secondary | ICD-10-CM | POA: Diagnosis not present

## 2019-07-14 DIAGNOSIS — E039 Hypothyroidism, unspecified: Secondary | ICD-10-CM | POA: Diagnosis not present

## 2019-07-14 DIAGNOSIS — R7301 Impaired fasting glucose: Secondary | ICD-10-CM | POA: Diagnosis not present

## 2019-08-03 DIAGNOSIS — G4733 Obstructive sleep apnea (adult) (pediatric): Secondary | ICD-10-CM | POA: Diagnosis not present

## 2019-08-15 ENCOUNTER — Encounter: Payer: Self-pay | Admitting: Neurology

## 2019-08-15 ENCOUNTER — Ambulatory Visit (INDEPENDENT_AMBULATORY_CARE_PROVIDER_SITE_OTHER): Payer: BC Managed Care – PPO | Admitting: Neurology

## 2019-08-15 ENCOUNTER — Other Ambulatory Visit: Payer: Self-pay

## 2019-08-15 VITALS — BP 122/82 | HR 70 | Temp 96.6°F | Ht 68.0 in | Wt 184.0 lb

## 2019-08-15 DIAGNOSIS — G4719 Other hypersomnia: Secondary | ICD-10-CM

## 2019-08-15 DIAGNOSIS — E119 Type 2 diabetes mellitus without complications: Secondary | ICD-10-CM | POA: Diagnosis not present

## 2019-08-15 DIAGNOSIS — Z9989 Dependence on other enabling machines and devices: Secondary | ICD-10-CM

## 2019-08-15 DIAGNOSIS — G4733 Obstructive sleep apnea (adult) (pediatric): Secondary | ICD-10-CM

## 2019-08-15 NOTE — Patient Instructions (Signed)

## 2019-08-15 NOTE — Progress Notes (Signed)
SLEEP MEDICINE CLINIC   Provider:  Larey Seat, M D  Primary Care Physician:  Crist Infante, MD   Referring Provider: Crist Infante, MD    CC: Excessive daytime sleepiness, witnessed apnea.   Rv from  08-15-2019 for [name], this time face to face.   The patient had tested positive for severe sleep apnea,  His home sleep test had revealed an overall apnea hypopnea index at 33.7/h and in rem sleep he reached 39/h this is considered a severe degree of obstructive sleep apnea but there was no associated complicating factor or comorbidity such as hypoxemia or cardiac arrhythmias seen.  We decided on auto titration device and this was set between 6 and 18 cmH2O pressure with 3 cm expiratory pressure relief.  The patient is using a DreamWear nasal cradle.  The patient finds this most comfortable and not easy to dislodge.  His residual AHI is 0.9/h which is an excellent resolution, he has minimal air leaks of 0.5 L/min.  The 95th percentile pressure is only 8 cmH2O which means that I can restrict the maximum pressure window to 12.  Compliance by days is 90% and compliance by over 4 hours of consecutive nightly use is 80%.  Average use at time 4 hours and 48 minutes which is sufficient to fulfill insurance compliance criteria.   HPI:  [name] [name] is a 48 y.o. male , seen here as in a referral/ revisit  from Dr. Joylene Draft for evaluation of possible OSA. Chief complaint according to patient : Excessive daytime sleepiness.     Sleep and medical history: Gout, DM /prediabetes, HTN, Hyperlipidemia. Genetic absence of alcohol-dehydrogenase.- no GERD, no Asthma history.   Family medical and sleep history: eldest bother of 2 has OSA , on CPAP.    Social history: Married, 2 adopted daughters, Psychologist, sport and exercise- non drinker , non tobacco user and caffeine use in form of iced tea or V8 with green tea one a day or less.    Sleep habits are as follows: The patient's professional work is requiring him  to work 1 day a week on 24 hour call, during which he sleeps in the hospital on call room, in a very uncomfortable bed.  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 When not on call and at home his habits are regular: Dinnertime would be around 6:30 PM, his evening exercises usually walk walking, he goes to bed around 11 PM does not watch TV or read in bed usually.  He stated that before corona bandemia he had no trouble sleeping no sleep latency has increased to about 30 minutes and he wakes up often at around 1:30 AM resulting from bizarre dreams.  He considers himself a back sleeper, sleeps in an adjustable bed elevated to about 12 degrees 4 feet and upper body with a memory gel pillow.  He does have a history of neck pain and the contour- pillow is helping to relieve that. He states that his wife hits him often with her elbow reminding him to change his sleep position because he still so loudly.  He rises at 6 AM.  He feels however not fully refreshed or restored and throughout the day struggles with excessive daytime sleepiness, he can nap whenever the opportunity arises.    Review of Systems: Out of a complete 14 system review, the patient complains of only the following symptoms, and all other reviewed systems are negative. How likely are you to doze in  the following situations: 0 = not likely, 1 = slight chance, 2 = moderate chance, 3 = high chance  Sitting and Reading? Watching Television? Sitting inactive in a public place (theater or meeting)? Lying down in the afternoon when circumstances permit? Sitting and talking to someone? Sitting quietly after lunch without alcohol? In a car, while stopped for a few minutes in traffic? As a passenger in a car for an hour without a break?  Total =  18/ 24 points,  snoring.   11/ 24 points on CPAP ! He no longer falls asleep in the doctors lounge.  He noted improvement within 6 weeks.  Wife is happy as he no longer snores.    Social History   Socioeconomic History  . Marital status: Married    Spouse name: Not on file  . Number of children: Not on file  . Years of education: Not on file  . Highest education level: Not on file  Occupational History  . Not on file  Social Needs  . Financial resource strain: Not on file  . Food insecurity    Worry: Not on file    Inability: Not on file  . Transportation needs    Medical: Not on file    Non-medical: Not on file  Tobacco Use  . Smoking status: Never Smoker  . Smokeless tobacco: Never Used  Substance and Sexual Activity  . Alcohol use: Yes    Comment: rare  . Drug use: No  . Sexual activity: Not on file  Lifestyle  . Physical activity    Days per week: Not on file    Minutes per session: Not on file  . Stress: Not on file  Relationships  . Social Herbalist on phone: Not on file    Gets together: Not on file    Attends religious service: Not on file    Active member of club or organization: Not on file    Attends meetings of clubs or organizations: Not on file    Relationship status: Not on file  . Intimate partner violence    Fear of current or ex partner: Not on file    Emotionally abused: Not on file    Physically abused: Not on file    Forced sexual activity: Not on file  Other Topics Concern  . Not on file  Social History Narrative  . Not on file    Family History  Problem Relation Age of Onset  . Colon cancer Maternal Grandmother   . Arthritis Mother   . Hypertension Father   . Diabetes Father   . Non-Hodgkin's lymphoma Father   . Hypertension Brother   . Colon polyps Neg Hx   . Esophageal cancer Neg Hx   . Rectal cancer Neg Hx   . Stomach cancer Neg Hx     Past Medical History:  Diagnosis Date  . Diabetes mellitus without complication ([place])   . Epigastric pain   . Gout   . Headache   . Hyperlipidemia   . Hypertension   . Hypothyroid   . Rectal bleeding     Past Surgical History:  Procedure Laterality  Date  . np past surgeries    . UPPER GASTROINTESTINAL ENDOSCOPY      Current Outpatient Medications  Medication Sig Dispense Refill  . allopurinol (ZYLOPRIM) 300 MG tablet Take 300 mg by mouth daily.    Marland Kitchen amLODipine (NORVASC) 5 MG tablet Take 5 mg by mouth daily.    Marland Kitchen  atorvastatin (LIPITOR) 80 MG tablet Take 80 mg by mouth.    . chlorthalidone (HYGROTON) 25 MG tablet Take 25 mg by mouth daily.  3  . cyclobenzaprine (FLEXERIL) 10 MG tablet TAKE 1 TABLET BY MOUTH THREE TIMES A DAY AS NEEDED FOR MUSCLE PAIN OR SPASM    . escitalopram (LEXAPRO) 10 MG tablet Take 10 mg by mouth daily.    Marland Kitchen ezetimibe (ZETIA) 10 MG tablet Take 10 mg by mouth daily.    Marland Kitchen KLOR-CON 10 10 MEQ tablet     . levothyroxine (SYNTHROID) 88 MCG tablet     . metFORMIN (GLUCOPHAGE-XR) 500 MG 24 hr tablet TAKE 1 TABLET BY MOUTH EVERY DAY IN THE EVENING WITH DINNER    . Omega-3 Fatty Acids (FISH OIL CONCENTRATE PO) Take 3,000 mg by mouth daily.     No current facility-administered medications for this visit.     Allergies as of 08/15/2019 - Review Complete 08/15/2019  Allergen Reaction Noted  . Ace inhibitors Other (See Comments) 11/12/2016    Vitals: BP 122/82   Pulse 70   Temp (!) 96.6 F (35.9 C)   Ht 5\' 8"  (1.727 m)   Wt 184 lb (83.5 kg)   BMI 27.98 kg/m  Last Weight:  Wt Readings from Last 1 Encounters:  08/15/19 184 lb (83.5 kg)   UKG:URKY mass index is 27.98 kg/m.     Last Height:   Ht Readings from Last 1 Encounters:  08/15/19 5\' 8"  (1.727 m)    Physical exam:  General: The patient is awake, alert and appears not in acute distress. The patient is well groomed. Head: Normocephalic, atraumatic. Neck is supple. Mallampati 3  neck circumference:17". Nasal airflow patent , Retrognathia is mild seen. No bruxism marks were noted, Cardiovascular:  without distended neck veins. Respiratory: able to hold his breath for 30 seconds or longer. Skin:  Without evidence of facial edema, or rash Trunk: BMI is  28.3. The patient's upper body posture is erect.   Neurologic exam : The patient is awake and alert, oriented to place and time.   Attention span & concentration ability appears normal.  Speech is fluent,  without  dysarthria, dysphonia or aphasia.  Mood and affect are appropriate.  Cranial nerves: No loss of smell or taste.  Pupils are equal . Extraocular movements  in vertical and horizontal planes intact and without nystagmus. Hearing intact.    Facial motor strength is symmetric and tongue and uvula move midline. Shoulder shrug was symmetrical.   Motor exam:  Symmetric, full ROM in upper extremities, neck and shoulder. Coordination: without evidence of ataxia, dysmetria or tremor. 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  Gait and station: reported: Patient walks without assistive device .  Assessment and Plan: High degree of OSA, now reduced to 0.5 AHI , no airleaks. High compliance.  Lost 8 ounds in the last 2 month.  Has slept better and has less EDS-   Follow Up Instructions:  Reduce upper pressure window to 13 cm water. He is well in the window for the 95% pressure needed.  Treatment of OSA, if diagnosed, may allow for better control of his metabolic syndrome.    I discussed the assessment and treatment plan with the patient. The patient was provided an opportunity to ask questions and all were answered. The patient agreed with the plan and demonstrated an understanding of the instructions.   The patient was advised to call back or seek an in-person evaluation if the  symptoms worsen or if the condition fails to improve as anticipated.  I provided 20 minutes of non-face-to-face time during this encounter and prepared for an additional 7 minutes.  Rv in 12 month.  Larey Seat, MD   Larey Seat, MD 0/37/9558, 3:16 AM  Certified in Neurology by ABPN Certified in [place] by [facility] [address], [place], [place] [address]

## 2019-09-03 DIAGNOSIS — G4733 Obstructive sleep apnea (adult) (pediatric): Secondary | ICD-10-CM | POA: Diagnosis not present

## 2019-10-03 DIAGNOSIS — G4733 Obstructive sleep apnea (adult) (pediatric): Secondary | ICD-10-CM | POA: Diagnosis not present

## 2019-12-11 DIAGNOSIS — H52203 Unspecified astigmatism, bilateral: Secondary | ICD-10-CM | POA: Diagnosis not present

## 2019-12-11 DIAGNOSIS — H5213 Myopia, bilateral: Secondary | ICD-10-CM | POA: Diagnosis not present

## 2019-12-11 DIAGNOSIS — R7303 Prediabetes: Secondary | ICD-10-CM | POA: Diagnosis not present

## 2019-12-11 DIAGNOSIS — H524 Presbyopia: Secondary | ICD-10-CM | POA: Diagnosis not present

## 2019-12-14 DIAGNOSIS — G4733 Obstructive sleep apnea (adult) (pediatric): Secondary | ICD-10-CM | POA: Diagnosis not present

## 2020-05-17 DIAGNOSIS — Z125 Encounter for screening for malignant neoplasm of prostate: Secondary | ICD-10-CM | POA: Diagnosis not present

## 2020-05-17 DIAGNOSIS — Z0000 Encounter for general adult medical examination without abnormal findings: Secondary | ICD-10-CM | POA: Diagnosis not present

## 2020-05-17 DIAGNOSIS — R7301 Impaired fasting glucose: Secondary | ICD-10-CM | POA: Diagnosis not present

## 2020-05-17 DIAGNOSIS — E7849 Other hyperlipidemia: Secondary | ICD-10-CM | POA: Diagnosis not present

## 2020-05-17 DIAGNOSIS — E038 Other specified hypothyroidism: Secondary | ICD-10-CM | POA: Diagnosis not present

## 2020-05-17 DIAGNOSIS — E785 Hyperlipidemia, unspecified: Secondary | ICD-10-CM | POA: Diagnosis not present

## 2020-05-17 DIAGNOSIS — M109 Gout, unspecified: Secondary | ICD-10-CM | POA: Diagnosis not present

## 2020-05-23 DIAGNOSIS — G4733 Obstructive sleep apnea (adult) (pediatric): Secondary | ICD-10-CM | POA: Diagnosis not present

## 2020-05-24 DIAGNOSIS — R3121 Asymptomatic microscopic hematuria: Secondary | ICD-10-CM | POA: Diagnosis not present

## 2020-05-24 DIAGNOSIS — Z1331 Encounter for screening for depression: Secondary | ICD-10-CM | POA: Diagnosis not present

## 2020-05-24 DIAGNOSIS — D126 Benign neoplasm of colon, unspecified: Secondary | ICD-10-CM | POA: Diagnosis not present

## 2020-05-24 DIAGNOSIS — N529 Male erectile dysfunction, unspecified: Secondary | ICD-10-CM | POA: Diagnosis not present

## 2020-05-24 DIAGNOSIS — R0683 Snoring: Secondary | ICD-10-CM | POA: Diagnosis not present

## 2020-05-24 DIAGNOSIS — R82998 Other abnormal findings in urine: Secondary | ICD-10-CM | POA: Diagnosis not present

## 2020-05-24 DIAGNOSIS — Z0000 Encounter for general adult medical examination without abnormal findings: Secondary | ICD-10-CM | POA: Diagnosis not present

## 2020-05-30 DIAGNOSIS — Z1212 Encounter for screening for malignant neoplasm of rectum: Secondary | ICD-10-CM | POA: Diagnosis not present

## 2020-08-19 ENCOUNTER — Other Ambulatory Visit: Payer: Self-pay

## 2020-08-19 ENCOUNTER — Encounter: Payer: Self-pay | Admitting: Neurology

## 2020-08-19 ENCOUNTER — Ambulatory Visit (INDEPENDENT_AMBULATORY_CARE_PROVIDER_SITE_OTHER): Payer: BC Managed Care – PPO | Admitting: Neurology

## 2020-08-19 VITALS — BP 138/91 | HR 95 | Ht 68.0 in | Wt 186.0 lb

## 2020-08-19 DIAGNOSIS — G4733 Obstructive sleep apnea (adult) (pediatric): Secondary | ICD-10-CM | POA: Diagnosis not present

## 2020-08-19 DIAGNOSIS — E119 Type 2 diabetes mellitus without complications: Secondary | ICD-10-CM

## 2020-08-19 DIAGNOSIS — Z9989 Dependence on other enabling machines and devices: Secondary | ICD-10-CM

## 2020-08-19 NOTE — Progress Notes (Signed)
SLEEP MEDICINE CLINIC   Provider:  Larey Seat, M D  Primary Care Physician:  Crist Infante, MD   Referring Provider: Crist Infante, MD    CC: Excessive daytime sleepiness, witnessed apnea.   Rv from 08/ 23/2021: At the pleasure of meeting with [name] earlier today who has been a compliant CPAP user but he does report difficulties especially with the headgear he has used the machine 63% of the time and average use at time is 3 hours 57 minutes- he is on call a lot.  This is an air sense AutoSet between 6 and 13 cm water pressure with 3 cm EPR.  The residual AHI is great 0.9 events per hour show a very good control of sleep apnea and there is not much of an air leak.  The question is if he can find headgear that makes it more comfortable and easier to use the Epworth Sleepiness Scale was endorsed at 8 points of the fatigue severity at 24.  Dream wear nasal pillow- he is bold and has problems with the rubbery head halo-    08-15-2019 for [name], this time face to face.  The patient had tested positive for severe sleep apnea,  His home sleep test had revealed an overall apnea hypopnea index at 33.7/h and in rem sleep he reached 39/h this is considered a severe degree of obstructive sleep apnea but there was no associated complicating factor or comorbidity such as hypoxemia or cardiac arrhythmias seen.  We decided on auto titration device and this was set between 6 and 18 cmH2O pressure with 3 cm expiratory pressure relief.  The patient is using a DreamWear nasal cradle.  The patient finds this most comfortable and not easy to dislodge.  His residual AHI is 0.9/h which is an excellent resolution, he has minimal air leaks of 0.5 L/min.  The 95th percentile pressure is only 8 cmH2O which means that I can restrict the maximum pressure window to 12.  Compliance by days is 90% and compliance by over 4 hours of consecutive nightly use is 80%.  Average use at time 4 hours and 48  minutes which is sufficient to fulfill insurance compliance criteria.   HPI:  [name] [name] is a 49 y.o. male , seen here as in a referral/ revisit  from Dr. Joylene Draft for evaluation of possible OSA. Chief complaint according to patient : Excessive daytime sleepiness.     Sleep and medical history: Gout, DM /prediabetes, HTN, Hyperlipidemia. Genetic absence of alcohol-dehydrogenase.- no GERD, no Asthma history.   Family medical and sleep history: eldest bother of 2 has OSA , on CPAP.    Social history: Married, 2 adopted daughters, Psychologist, sport and exercise- non drinker , non tobacco user and caffeine use in form of iced tea or V8 with green tea one a day or less.    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 When not on call and at home his habits are regular: Dinnertime would be around 6:30 PM, his evening exercises usually walk walking, he goes to bed around 11 PM does not watch TV or read in bed usually.  He stated that before corona bandemia he had no trouble sleeping no sleep latency has increased to about  30 minutes and he wakes up often at around 1:30 AM resulting from bizarre dreams.  He considers himself a back sleeper, sleeps in an adjustable bed elevated to about 12 degrees 4 feet and upper body with a memory gel pillow.  He does have a history of neck pain and the contour- pillow is helping to relieve that. He states that his wife hits him often with her elbow reminding him to change his sleep position because he still so loudly.  He rises at 6 AM.  He feels however not fully refreshed or restored and throughout the day struggles with excessive daytime sleepiness, he can nap whenever the opportunity arises.    Review of Systems: Out of a complete 14 system review, the patient complains of  only the following symptoms, and all other reviewed systems are negative. How likely are you to doze in the following situations: 0 = not likely, 1 = slight chance, 2 = moderate chance, 3 = high chance  Sitting and Reading? Watching Television? Sitting inactive in a public place (theater or meeting)? Lying down in the afternoon when circumstances permit? Sitting and talking to someone? Sitting quietly after lunch without alcohol? In a car, while stopped for a few minutes in traffic? As a passenger in a car for an hour without a break?  Total =  18/ 24 points,  snoring.   11/ 24 points on CPAP ! He no longer falls asleep in the doctors lounge.  He noted improvement within 6 weeks.  Wife is happy as he no longer snores.   Social History   Socioeconomic History  . Marital status: Married    Spouse name: Not on file  . Number of children: Not on file  . Years of education: Not on file  . Highest education level: Not on file  Occupational History  . Not on file  Tobacco Use  . Smoking status: Never Smoker  . Smokeless tobacco: Never Used  Vaping Use  . Vaping Use: Never used  Substance and Sexual Activity  . Alcohol use: Yes    Comment: rare  . Drug use: No  . Sexual activity: Not on file  Other Topics Concern  . Not on file  Social History Narrative  . Not on file   Social Determinants of Health   Financial Resource Strain:   . Difficulty of Paying Living Expenses: Not on file  Food Insecurity:   . Worried About Charity fundraiser in the Last Year: Not on file  . Ran Out of Food in the Last Year: Not on file  Transportation Needs:   . Lack of Transportation (Medical): Not on file  . Lack of Transportation (Non-Medical): Not on file  Physical Activity:   . Days of Exercise per Week: Not on file  . Minutes of Exercise per Session: Not on file  Stress:   . Feeling of Stress : Not on file  Social Connections:   . Frequency of Communication with Friends and  Family: Not on file  . Frequency of Social Gatherings with Friends and Family: Not on file  . Attends Religious Services: Not on file  . Active Member of Clubs or Organizations: Not on file  . Attends Archivist Meetings: Not on file  . Marital Status: Not on file  Intimate Partner Violence:   . Fear of Current or Ex-Partner: Not on file  . Emotionally Abused: Not on file  . Physically Abused: Not on file  . Sexually  Abused: Not on file    Family History  Problem Relation Age of Onset  . Colon cancer Maternal Grandmother   . Arthritis Mother   . Hypertension Father   . Diabetes Father   . Non-Hodgkin's lymphoma Father   . Hypertension Brother   . Colon polyps Neg Hx   . Esophageal cancer Neg Hx   . Rectal cancer Neg Hx   . Stomach cancer Neg Hx     Past Medical History:  Diagnosis Date  . Diabetes mellitus without complication ([place])   . Epigastric pain   . Gout   . Headache   . Hyperlipidemia   . Hypertension   . Hypothyroid   . Rectal bleeding     Past Surgical History:  Procedure Laterality Date  . np past surgeries    . UPPER GASTROINTESTINAL ENDOSCOPY      Current Outpatient Medications  Medication Sig Dispense Refill  . allopurinol (ZYLOPRIM) 300 MG tablet Take 300 mg by mouth daily.    Marland Kitchen amLODipine (NORVASC) 5 MG tablet Take 5 mg by mouth daily.    Marland Kitchen atorvastatin (LIPITOR) 80 MG tablet Take 80 mg by mouth.    . chlorthalidone (HYGROTON) 25 MG tablet Take 25 mg by mouth daily.  3  . ezetimibe (ZETIA) 10 MG tablet Take 10 mg by mouth daily.    Marland Kitchen KLOR-CON 10 10 MEQ tablet     . levothyroxine (SYNTHROID) 88 MCG tablet Take 88 mcg by mouth daily before breakfast.     . metFORMIN (GLUCOPHAGE-XR) 500 MG 24 hr tablet Take 500 mg by mouth in the morning and at bedtime.    . Omega-3 Fatty Acids (FISH OIL CONCENTRATE PO) Take 3,000 mg by mouth daily.     No current facility-administered medications for this visit.    Allergies as of 08/19/2020 - Review  Complete 08/19/2020  Allergen Reaction Noted  . Ace inhibitors Other (See Comments) 11/12/2016    Vitals: BP (!) 138/91   Pulse 95   Ht 5\' 8"  (1.727 m)   Wt 186 lb (84.4 kg)   BMI 28.28 kg/m  Last Weight:  Wt Readings from Last 1 Encounters:  08/19/20 186 lb (84.4 kg)   AST:MHDQ mass index is 28.28 kg/m.     Last Height:   Ht Readings from Last 1 Encounters:  08/19/20 5\' 8"  (1.727 m)    Physical exam:  General: The patient is awake, alert and appears not in acute distress. The patient is well groomed. Head: Normocephalic, atraumatic. Neck is supple. Mallampati 3  neck circumference:17". Nasal airflow patent , Retrognathia is mild seen. No bruxism marks were noted, Cardiovascular:  without distended neck veins. Respiratory: able to hold his breath for 30 seconds or longer. Skin:  Without evidence of facial edema, or rash Trunk: BMI is 28.3. The patient's upper body posture is erect.   Neurologic exam : The patient is awake and alert, oriented to place and time.   Attention span & concentration ability appears normal.  Speech is fluent,  without  dysarthria, dysphonia or aphasia.  Mood and affect are appropriate.  Cranial nerves: No loss of smell or taste.  Pupils are equal . Extraocular movements  in vertical and horizontal planes intact and without nystagmus. Hearing intact.    Facial motor strength is symmetric and tongue and uvula move midline. Shoulder shrug was symmetrical.   Motor exam:  Symmetric, full ROM in upper extremities, neck and shoulder. Coordination: without evidence of ataxia, dysmetria or tremor.  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  Gait and station: reported: Patient walks without assistive device .  Assessment and Plan: High degree of OSA, now reduced to 0.5 AHI , no airleaks. High compliance.  Lost 8 ounds in the last 2 month.  Has slept better and has less EDS-   Keep this setting of pressure bu give a different  head gear.  Order a bella  swift with ear loopes.    I discussed the assessment and treatment plan with the patient. The patient was provided an opportunity to ask questions and all were answered. The patient agreed with the plan and demonstrated an understanding of the instructions.   The patient was advised to call back or seek an in-person evaluation if the symptoms worsen or if the condition fails to improve as anticipated.  I provided 20 minutes of non-face-to-face time during this encounter and prepared for an additional 7 minutes.  Rv in 12 month.  Larey Seat, MD   Larey Seat, MD 09/21/4627, 6:38 PM  Certified in Neurology by ABPN Certified in Sleep Medicine by [facility] [address], [place], [place] [address]

## 2020-08-19 NOTE — Patient Instructions (Signed)

## 2020-08-27 ENCOUNTER — Other Ambulatory Visit: Payer: Self-pay

## 2020-08-27 ENCOUNTER — Encounter: Payer: Self-pay | Admitting: Dermatology

## 2020-08-27 ENCOUNTER — Ambulatory Visit (INDEPENDENT_AMBULATORY_CARE_PROVIDER_SITE_OTHER): Payer: BC Managed Care – PPO | Admitting: Dermatology

## 2020-08-27 DIAGNOSIS — L309 Dermatitis, unspecified: Secondary | ICD-10-CM

## 2020-08-27 DIAGNOSIS — B359 Dermatophytosis, unspecified: Secondary | ICD-10-CM | POA: Diagnosis not present

## 2020-08-27 DIAGNOSIS — L570 Actinic keratosis: Secondary | ICD-10-CM

## 2020-08-27 DIAGNOSIS — Z1283 Encounter for screening for malignant neoplasm of skin: Secondary | ICD-10-CM | POA: Diagnosis not present

## 2020-08-27 DIAGNOSIS — D225 Melanocytic nevi of trunk: Secondary | ICD-10-CM | POA: Diagnosis not present

## 2020-08-27 DIAGNOSIS — L821 Other seborrheic keratosis: Secondary | ICD-10-CM

## 2020-08-27 DIAGNOSIS — D229 Melanocytic nevi, unspecified: Secondary | ICD-10-CM

## 2020-08-27 DIAGNOSIS — D1801 Hemangioma of skin and subcutaneous tissue: Secondary | ICD-10-CM

## 2020-08-27 NOTE — Progress Notes (Signed)
ln2 right shin Also koh done in office and sent fungal

## 2020-08-27 NOTE — Patient Instructions (Addendum)
Several dermatological issues discussed with Dr. Verita Lamb, date of birth [birth year].  Primary concern was a minimally progressive pink rough spot on his right shin for at least the past 6 months.  His primary physician tried triamcinolone on this and there was no benefit.  There is no itching and never blisters or forms pustules.  Examination showed an arciform 6 x 20 mm patch with subtle scale and a sharp margin.  KOH immediate and 1 hour later was negative for fungus.  There is no sign of fungus on toe webs or toenails of either foot.  Differential diagnosis would include nummular eczema, localized psoriasis, or (perhaps most likely) solitary porokeratosis.  We discussed a small risk of porokeratosis evolving over an extended period of time into a superficial nonmelanoma cancer, but this really is not a major concern.  He chose to defer obtaining confirmatory biopsy.  Liquid nitrogen freeze done very superficially for a total of 5 seconds.  He can expect this area may swell and peel over the next 2 weeks, but there is no local care.  Should this produce partial improvement, [name] has the option of repeating this in 2+ months.  Additionally, Dr. Prince Solian was nice enough to allow me to do a skin check over his back.  He has half dozen monochrome dark brown moles mostly on the lower back.  All of these show similar dermoscopy and do not require removal.  There is a small cherry angioma on the lower central back and a 4 mm tan textured seborrheic keratosis in the mid back; these require no intervention.  Initial follow-up will be via MyChart or phone call in 4 to 6weeks to let me know with the freezing was of any benefit for the right shin lesion.

## 2020-09-21 NOTE — Progress Notes (Signed)
   New Patient   Subjective  [name] [name] is a 49 y.o. male who presents for the following: Establish Care, Skin Lesion (on the right shin--itchy--no pain--noticed at least 6 months), and Nevus (check on a few moles on the back).  Scaly spot Location: Right chin Duration: 6+ months Quality:  Associated Signs/Symptoms: Modifying Factors: Topical triamcinolone no benefit Severity:  Timing: Context:    The following portions of the chart were reviewed this encounter and updated as appropriate: Tobacco  Allergies  Meds  Problems  Med Hx  Surg Hx  Fam Hx      Objective  Well appearing patient in no apparent distress; mood and affect are within normal limits.  All sun exposed areas plus back examined. Plus legs.   Assessment & Plan  Dermatophytosis, unspecified  Other Related Procedures Culture, fungus without smear POCT Skin KOH  Dermatitis Right Lower Leg - Anterior  Treat as solitary porokeratosis with 5 seconds superficial liquid nitrogen freeze.  May repeat this in 2 months if partially successful.  Fungal culture obtained.  Nevus Mid Back  Twice annual home skin exam with wife (physician assistant). Several dermatological issues discussed with Dr. Verita Lamb, date of birth 1971/08/26.  Primary concern was a minimally progressive pink rough spot on his right shin for at least the past 6 months.  His primary physician tried triamcinolone on this and there was no benefit.  There is no itching and never blisters or forms pustules.  Examination showed an arciform 6 x 20 mm patch with subtle scale and a sharp margin.  KOH immediate and 1 hour later was negative for fungus.  There is no sign of fungus on toe webs or toenails of either foot.  Differential diagnosis would include nummular eczema, localized psoriasis, or (perhaps most likely) solitary porokeratosis.  We discussed a small risk of porokeratosis evolving over an extended period of time into a superficial  nonmelanoma cancer, but this really is not a major concern.  He chose to defer obtaining confirmatory biopsy.  Liquid nitrogen freeze done very superficially for a total of 5 seconds.  He can expect this area may swell and peel over the next 2 weeks, but there is no local care.  Should this produce partial improvement, [name] has the option of repeating this in 2+ months.  Additionally, Dr. Prince Solian was nice enough to allow me to do a skin check over his back.  He has half dozen monochrome dark brown moles mostly on the lower back.  All of these show similar dermoscopy and do not require removal.  There is a small cherry angioma on the lower central back and a 4 mm tan textured seborrheic keratosis in the mid back; these require no intervention.  Initial follow-up will be via MyChart or phone call in 4 to 6weeks to let me know with the freezing was of any benefit for the right shin lesion.

## 2020-09-27 ENCOUNTER — Encounter: Payer: Self-pay | Admitting: Dermatology

## 2020-09-27 DIAGNOSIS — G4733 Obstructive sleep apnea (adult) (pediatric): Secondary | ICD-10-CM | POA: Diagnosis not present

## 2020-09-27 LAB — CULTURE, FUNGUS WITHOUT SMEAR
MICRO NUMBER:: 10899304
SPECIMEN QUALITY:: ADEQUATE

## 2020-10-09 LAB — POCT SKIN KOH

## 2020-11-25 DIAGNOSIS — E1169 Type 2 diabetes mellitus with other specified complication: Secondary | ICD-10-CM | POA: Diagnosis not present

## 2020-11-25 DIAGNOSIS — E039 Hypothyroidism, unspecified: Secondary | ICD-10-CM | POA: Diagnosis not present

## 2020-12-16 DIAGNOSIS — H524 Presbyopia: Secondary | ICD-10-CM | POA: Diagnosis not present

## 2020-12-16 DIAGNOSIS — H52203 Unspecified astigmatism, bilateral: Secondary | ICD-10-CM | POA: Diagnosis not present

## 2020-12-16 DIAGNOSIS — E119 Type 2 diabetes mellitus without complications: Secondary | ICD-10-CM | POA: Diagnosis not present

## 2020-12-16 DIAGNOSIS — H5213 Myopia, bilateral: Secondary | ICD-10-CM | POA: Diagnosis not present

## 2021-03-28 ENCOUNTER — Other Ambulatory Visit (HOSPITAL_COMMUNITY): Payer: Self-pay | Admitting: *Deleted

## 2021-04-03 ENCOUNTER — Other Ambulatory Visit: Payer: Self-pay

## 2021-04-03 ENCOUNTER — Ambulatory Visit (HOSPITAL_BASED_OUTPATIENT_CLINIC_OR_DEPARTMENT_OTHER)
Admission: RE | Admit: 2021-04-03 | Discharge: 2021-04-03 | Disposition: A | Discharge status: Home / Self Care | Payer: BC Managed Care – PPO | Source: Ambulatory Visit | Attending: Cardiology | Admitting: Cardiology

## 2021-05-27 DIAGNOSIS — E785 Hyperlipidemia, unspecified: Secondary | ICD-10-CM | POA: Diagnosis not present

## 2021-05-27 DIAGNOSIS — E039 Hypothyroidism, unspecified: Secondary | ICD-10-CM | POA: Diagnosis not present

## 2021-05-27 DIAGNOSIS — R7301 Impaired fasting glucose: Secondary | ICD-10-CM | POA: Diagnosis not present

## 2021-05-27 DIAGNOSIS — M109 Gout, unspecified: Secondary | ICD-10-CM | POA: Diagnosis not present

## 2021-05-27 DIAGNOSIS — Z125 Encounter for screening for malignant neoplasm of prostate: Secondary | ICD-10-CM | POA: Diagnosis not present

## 2021-06-04 DIAGNOSIS — Z0000 Encounter for general adult medical examination without abnormal findings: Secondary | ICD-10-CM | POA: Diagnosis not present

## 2021-06-04 DIAGNOSIS — E039 Hypothyroidism, unspecified: Secondary | ICD-10-CM | POA: Diagnosis not present

## 2021-06-04 DIAGNOSIS — Z1389 Encounter for screening for other disorder: Secondary | ICD-10-CM | POA: Diagnosis not present

## 2021-08-11 ENCOUNTER — Encounter: Payer: Self-pay | Admitting: Adult Health

## 2021-08-25 ENCOUNTER — Ambulatory Visit: Payer: BC Managed Care – PPO | Admitting: Adult Health

## 2021-09-10 ENCOUNTER — Ambulatory Visit (INDEPENDENT_AMBULATORY_CARE_PROVIDER_SITE_OTHER): Payer: No Typology Code available for payment source | Admitting: Neurology

## 2021-09-10 ENCOUNTER — Encounter: Payer: Self-pay | Admitting: Neurology

## 2021-09-10 VITALS — BP 140/89 | HR 100 | Ht 68.0 in | Wt 179.5 lb

## 2021-09-10 DIAGNOSIS — Z9989 Dependence on other enabling machines and devices: Secondary | ICD-10-CM

## 2021-09-10 DIAGNOSIS — G4733 Obstructive sleep apnea (adult) (pediatric): Secondary | ICD-10-CM

## 2021-09-10 NOTE — Patient Instructions (Signed)
Safe Surgery and Sleep Apnea Sleep apnea is a condition in which breathing pauses or becomes shallow during sleep. Most people with the condition are not aware that they have it. Your health care providers need to know whether or not you have sleep apnea, especially if you are having surgery. Sleep apnea can increase your risk of complications during and after surgery. Tell a health care provider about: Any medical conditions you have, especially if you have sleep apnea. Any allergies you have. All medicines you are taking, including vitamins, herbs, eye drops, creams, and over-the-counter medicines. Any blood disorders you have. Any problems you or family members have had with anesthetic medicines. Any surgeries you have had. Whether you are pregnant or may be pregnant. What are the risks of having surgery? Untreated sleep apnea increases the risk for certain complications during and after surgery. This is because when you have sleep apnea, your airways are more sensitive to medicines used during surgery. The airways can collapse and block the flow of air.  Having untreated sleep apnea can increase your risk for: A longer stay in the recovery room or hospital. Breathing difficulties such as low oxygen levels after surgery. Increased pain after surgery. Irregular heart rhythms. Stroke. Heart attack. You and your health care provider can take steps to help prevent these and other complications. What happens before the surgery? Sleep apnea screening Sleep apnea screening is a series of questions that determine if you are at risk for sleep apnea. Before you have surgery, get screened for sleep apnea and talk with your surgeon and primary health care provider about your results. Screening usually involves answering questions about your sleep quality. Ask your health care provider if you can be screened, or take a screening test yourself. You can find these tests online at the American Sleep Apnea  Association website. Some questions you may be asked include: Do you snore? Is your sleep restless? Do you have daytime sleepiness? Has a partner or spouse told you that you stop breathing during sleep? Have you had trouble concentrating or memory loss? Answer these questions honestly. If a screening test is positive, this means you are at risk for the condition. Further testing may be needed to confirm a diagnosis of sleep apnea. General instructions Talk to your health care provider about your individual risks based on your screening results and the type of surgery you will be having. If you have a sleep apnea device (positive airway pressure device), wear it as prescribed. If you have not been wearing your device, talk with your health care provider about why you have not been wearing it. There are ways to improve your use of the device, such as: Adjusting the mask. Adding humidified air. Getting treatment for nasal congestion. Do not use any products that contain nicotine or tobacco. These products include cigarettes, chewing tobacco, and vaping devices, such as e-cigarettes. If you need help quitting, ask your health care provider. Do not drink alcohol the day before or after your surgery because it can worsen your sleep apnea. What happens on the day of surgery? If told by your health care provider, bring your sleep apnea device with you. Wear your sleep apnea device when you are sleeping during your hospital stay, or as told by your health care provider. Ask your health care provider what special considerations will be taken during and after your surgery. What can I expect after the surgery? You may need to be given extra oxygen and wear a continuous   oxygen monitor (pulse oximetry). For your safety, you may need to stay in the recovery room or hospital for longer than usual. Follow these instructions at home: Medicines Avoid using sleep medicines unless they are prescribed by a health  care provider who is aware of the results of your sleep apnea screening. Avoid using sleep medicines while taking opioid pain medicine. Limit your use of opioid pain medicines as much as possible. Ask your health care provider what is a safe amount to use. Ask about using pain medicines that do not affect your breathing, such as NSAIDs or acetaminophen. General instructions  If your health care provider approves, raise the head of your bed or lie on your side. Do not lie flat on your back. Follow instructions from your health care provider about wearing your sleep device: Anytime you are sleeping, including during daytime naps. While taking prescription pain medicines, sleeping medicines, or medicines that make you drowsy. If you will be going home right after the procedure, plan to have a responsible adult care for you for the time you are told. This is important. Where to find more information For more information about sleep apnea screening and healthy sleep, visit these websites: Centers for Disease Control and Prevention: http://www.wolf.info/ American Sleep Apnea Association: www.sleepapnea.org Contact a health care provider if: You have sleep apnea or think you may be at risk for sleep apnea, and you are scheduled for surgery. Get help right away if: You have trouble breathing. You are very drowsy and cannot stay awake. You are told that you have pauses in your breathing during sleep after surgery. You have chest pain. You have a fast heartbeat. These symptoms may represent a serious problem that is an emergency. Do not wait to see if the symptoms will go away. Get medical help right away. Call your local emergency services (911 in the [place] Do not drive yourself to the hospital. Summary Your health care providers need to know whether or not you have sleep apnea, especially if you are having surgery. If you have sleep apnea, you are at an increased risk for complications during surgery. You  and your health care provider can take precautions to help prevent complications. If you have sleep apnea, make sure to tell your health care provider and anesthesia specialist. This information is not intended to replace advice given to you by your health care provider. Make sure you discuss any questions you have with your health care provider. Document Revised: 11/22/2020 Document Reviewed: 11/22/2020 Elsevier Patient Education  2022 [employer].
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
## 2022-07-08 DIAGNOSIS — Z1331 Encounter for screening for depression: Secondary | ICD-10-CM | POA: Diagnosis not present

## 2022-07-08 DIAGNOSIS — E1169 Type 2 diabetes mellitus with other specified complication: Secondary | ICD-10-CM | POA: Diagnosis not present

## 2022-07-08 DIAGNOSIS — E039 Hypothyroidism, unspecified: Secondary | ICD-10-CM | POA: Diagnosis not present

## 2022-07-08 DIAGNOSIS — R82998 Other abnormal findings in urine: Secondary | ICD-10-CM | POA: Diagnosis not present

## 2022-07-08 DIAGNOSIS — Z0000 Encounter for general adult medical examination without abnormal findings: Secondary | ICD-10-CM | POA: Diagnosis not present

## 2022-07-08 DIAGNOSIS — Z1389 Encounter for screening for other disorder: Secondary | ICD-10-CM | POA: Diagnosis not present

## 2022-07-29 ENCOUNTER — Encounter: Payer: Self-pay | Admitting: Cardiology

## 2022-08-16 NOTE — Progress Notes (Signed)
Error

## 2022-09-10 ENCOUNTER — Encounter: Payer: Self-pay | Admitting: Neurology

## 2022-09-10 ENCOUNTER — Ambulatory Visit: Payer: BC Managed Care – PPO | Admitting: Neurology

## 2022-09-10 VITALS — BP 124/86 | HR 74 | Ht 68.0 in | Wt 164.0 lb

## 2022-09-10 DIAGNOSIS — G4733 Obstructive sleep apnea (adult) (pediatric): Secondary | ICD-10-CM | POA: Diagnosis not present

## 2022-09-10 DIAGNOSIS — Z9989 Dependence on other enabling machines and devices: Secondary | ICD-10-CM | POA: Diagnosis not present

## 2022-09-10 DIAGNOSIS — R634 Abnormal weight loss: Secondary | ICD-10-CM | POA: Diagnosis not present
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
## 2022-09-10 NOTE — Patient Instructions (Signed)
KEEPING IT CLEAN: CPAP HYGIENE PROPER UPKEEP OF YOUR CPAP MACHINE CAN HELP ENSURE THE DEVICE FUNCTIONS PROPERLY CPAP CLEANING INSTRUCTIONS Along with proper CPAP cleaning it is recommended that you replace your mask, tubing and filters once very 3 months and more frequently if you are sick.   DAILY CLEANING Do not use moisturizing soaps, bleach, scented oils, chlorine, or alcohol-based solutions to clean your supplies. These solutions may cause irritation to your skin and lungs and may reduce the life of your products. Dawn [employer] or Comparable works best for daily cleaning.  **If you've been sick, it's smart to wash your mask, tubing, humidifier and filter daily until your cold, flu or virus symptoms are gone. That can help reduce the amount of time you spend under the weather.  Before using your mask -wash your face daily with soap and water to remove excess facial oils. Wipe down your mask (including areas that come in contact with your skin) using a damp towel with soap and warm water. This will remove any oils, dead skin cells, and sweat on the mask that can affect the quality of the seal. Gently rinse with a clean towel and let the mask air-dry out of direct sunlight. You can also use unscented baby wipes or pre-moistened towels designed specifically for cleaning CPAP masks, which are available on-line. DO NOT USE CLOROX OR DISINFECTING WIPES. If your unit has a humidifier, empty any leftover water instead of letting in sit in the unit all day. Refill the humidifier with clean, distilled water right before bedtime for optimal use WEEKLY (OR MORE FREQUENT) CLEANING Your mask and tubing need a full bath at least once a week to keep it free of dust, bacteria, and germs. (During COVID-19 or any other flu/virus we recommend more frequent cleaning) Clean the CPAP tubing, nasal mask, and headgear in a bathroom sink filled with warm water and a few drops of ammonia-free, mild dish detergent. Avoid  using stronger cleaning products, as they may damage the mask or leave harmful residue. Swirl all parts around for about five minutes, rinse well and let air dry during the day. Hang the tubing over the shower rod, on a towel rack or in the laundry room to ensure all the water drips out. The mask and headgear can be air-dried on a towel or hung on a hook or hanger. You should also wipe down your CPAP machine with a damp cloth. Ensure the unit is unplugged. The towel shouldn't be too damp or wet, as water could get into the machine. Clean the filter by removing it and rinsing it in warm tap water. Run it under the water and squeeze to make sure there is no dust. Then blot down the filter with a towel. Do not wash your machine's white filter, if one is present--those are disposable and should be replaced every two weeks. If you are recovering from being sick, we recommend changing the filter sooner. If your CPAP has a humidifier, that also needs to be cleaned weekly. Empty any remaining water and then wash the water chamber in the sink with warm soapy water. Rinse well and drain out as much of the water as possible. Let the chamber air-dry before placing it back into the CPAP unit. Every other week you should disinfect the humidifier. Do that by soaking it in a solution of one-part vinegar to five parts water for 30 minutes, thoroughly rinsing and then placing in your dishwasher's top rack for washing. And keep  it clean by using only distilled water to prevent mineral deposits that can build up and cause damage to your machine. IMPORTANT TIPS Make caring for your CPAP equipment part of your morning routine. Keep machine and accessories out of direct sunlight to avoid damaging them. Never use bleach to clean accessories. Place machine on a level surface and away from curtains that may interfere with the air intake. Keep track of when you should order replacement parts for your mask and accessories so that  you always get the most out of your CPAP. You can also sign up for Auto Supply by contacting our DME department at [email or web address] **The following are examples of soap that may be used: [employer], [place] soap (plain).  With a little upkeep, your CPAP can continue to help you breathe better for a long time. Just a few minutes a day can help keep your CPAP running efficiently for years to come.  =

## 2023-02-03 DIAGNOSIS — E1169 Type 2 diabetes mellitus with other specified complication: Secondary | ICD-10-CM | POA: Diagnosis not present

## 2023-02-03 DIAGNOSIS — E039 Hypothyroidism, unspecified: Secondary | ICD-10-CM | POA: Diagnosis not present

## 2023-02-03 DIAGNOSIS — I10 Essential (primary) hypertension: Secondary | ICD-10-CM | POA: Diagnosis not present

## 2023-03-21 ENCOUNTER — Encounter: Payer: Self-pay | Admitting: Internal Medicine

## 2023-03-21 ENCOUNTER — Telehealth: Payer: Self-pay | Admitting: Internal Medicine

## 2023-03-21 NOTE — Telephone Encounter (Signed)
Hx 3 mm adenoma 10/2017 - Dr. Ardis Hughs  Asking that I do next colonoscopy

## 2023-03-24 NOTE — Telephone Encounter (Signed)
Recall placed for 01/2024 with Dr Carlean Purl.

## 2023-03-24 NOTE — Telephone Encounter (Signed)
Reviewed w/ him - can wait until 2025 per new guidelines and he is ok w/ that   Place colonoscopy recall for 01/2024 w/ me please

## 2023-04-08 DIAGNOSIS — I10 Essential (primary) hypertension: Secondary | ICD-10-CM | POA: Diagnosis not present

## 2023-04-09 DIAGNOSIS — I10 Essential (primary) hypertension: Secondary | ICD-10-CM | POA: Diagnosis not present

## 2023-08-23 DIAGNOSIS — E039 Hypothyroidism, unspecified: Secondary | ICD-10-CM | POA: Diagnosis not present

## 2023-08-23 DIAGNOSIS — M109 Gout, unspecified: Secondary | ICD-10-CM | POA: Diagnosis not present

## 2023-08-23 DIAGNOSIS — Z125 Encounter for screening for malignant neoplasm of prostate: Secondary | ICD-10-CM | POA: Diagnosis not present

## 2023-08-23 DIAGNOSIS — E1169 Type 2 diabetes mellitus with other specified complication: Secondary | ICD-10-CM | POA: Diagnosis not present

## 2023-08-23 DIAGNOSIS — E785 Hyperlipidemia, unspecified: Secondary | ICD-10-CM | POA: Diagnosis not present

## 2023-09-01 DIAGNOSIS — K219 Gastro-esophageal reflux disease without esophagitis: Secondary | ICD-10-CM | POA: Diagnosis not present

## 2023-09-01 DIAGNOSIS — E1169 Type 2 diabetes mellitus with other specified complication: Secondary | ICD-10-CM | POA: Diagnosis not present

## 2023-09-01 DIAGNOSIS — R82998 Other abnormal findings in urine: Secondary | ICD-10-CM | POA: Diagnosis not present

## 2023-09-01 DIAGNOSIS — I10 Essential (primary) hypertension: Secondary | ICD-10-CM | POA: Diagnosis not present

## 2023-09-01 DIAGNOSIS — Z0000 Encounter for general adult medical examination without abnormal findings: Secondary | ICD-10-CM | POA: Diagnosis not present

## 2023-09-01 DIAGNOSIS — R490 Dysphonia: Secondary | ICD-10-CM | POA: Diagnosis not present

## 2023-09-01 DIAGNOSIS — Z23 Encounter for immunization: Secondary | ICD-10-CM | POA: Diagnosis not present

## 2023-10-11 ENCOUNTER — Encounter: Payer: Self-pay | Admitting: Neurology

## 2023-10-11 ENCOUNTER — Ambulatory Visit: Payer: BC Managed Care – PPO | Admitting: Neurology

## 2023-10-11 VITALS — BP 125/82 | HR 75 | Ht 68.0 in | Wt 172.0 lb

## 2023-10-11 DIAGNOSIS — R634 Abnormal weight loss: Secondary | ICD-10-CM

## 2023-10-11 DIAGNOSIS — G4733 Obstructive sleep apnea (adult) (pediatric): Secondary | ICD-10-CM | POA: Diagnosis not present

## 2023-10-11 DIAGNOSIS — R0683 Snoring: Secondary | ICD-10-CM | POA: Diagnosis not present

## 2023-10-11 DIAGNOSIS — E119 Type 2 diabetes mellitus without complications: Secondary | ICD-10-CM

## 2023-10-11 NOTE — Progress Notes (Signed)
Provider:  Melvyn Novas, MD   Primary Care Physician:  Rodrigo Ran, MD [address] [place] [address]     Referring Provider: Rodrigo Ran, Md [address] [place],  [place] [address]          Chief Complaint according to patient   Patient presents with:                HISTORY OF PRESENT ILLNESS:  [name] [name] is a 52 y.o. male patient who is here for revisit 10/11/2023 for CPAP Compliance in therapy of OSA .   93% daily compliance, 77% by hours. 5 h 58 m.  06- 13 cm water , 3 cm EPR.  Residual AHI is 1.2/h.    95% pressure at 8,4 cm . Air leak: 6.7L/ minutes FSS at 18/ 63 points. ESS: 7/ 24 points.    Excessive daytime sleepiness, witnessed apnea.  09-10-2022:  I have the pleasure of meeting today with Dr. Birdie Sons, MD. He has been a complaint CPAP user and has less daytime sleepiness.  Overall his use of CPAP has been without any hiccups, he is stable, his compliance is 90% for days with an AutoSet between 6 and 13 cm water pressure and 3 cm EPR.  The AHI residual is 1.5/h.  90th percentile pressure is 8 cm water.  There are no Cheyne-Stokes respirations identified.  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  This has led to him using the CPAP more often but also he sleeps usually in a different bedroom as not to wake his spouse and the CPAP is not effective.   His Epworth sleepiness score is in normal range at 7 out of 24 points and he is not excessively fatigued in daytime.   Since I last saw Dr. Corliss Skains he lost about 25 pounds his current body mass index is 24.9 in clothing here today blood pressure 124/86 mmHg. On Ozempic to combat fatty liver.       09-10-2021:I have the pleasure of meeting today with Dr. Birdie Sons, MD. He endorses the Epworth sleepiness score at 10 points which is an average degree of sleepiness, there has been no exacerbation of  fatigue noted, he has been a very compliant CPAP user in spite of his on-call duties so his compliance is currently 77% by hours with an average use at time of 6 hours and 4 minutes.  His EPR level is 3 cmH2O imposed on a minimum pressure of 6 maximum pressure of 13 cmH2O. His on-call duties also have changed recently so there is more sleep at home.  The residual AHI is 1.7 which is an excellent resolution of his baseline he is using the 95th percentile of 8.6 cm water pressure and his air leaks are very low at the 95th percentile of 4.3 L a minute.   Since he has no resurgence of excessive daytime sleepiness or fatigue I think that we do not need to change any of the current settings.  Medication wise there has been no change             Rv from 08/ 23/2021: At the pleasure of meeting with Dr. Joesph July earlier today who has been a compliant CPAP user but he does report difficulties especially with the headgear he has used the machine 63% of the time and average use  at time is 3 hours 57 minutes- he is on call a lot.  This is an air sense AutoSet between 6 and 13 cm water pressure with 3 cm EPR.  The residual AHI is great 0.9 events per hour show a very good control of sleep apnea and there is not much of an air leak.  The question is if he can find headgear that makes it more comfortable and easier to use the Epworth Sleepiness Scale was endorsed at 8 points of the fatigue severity at 24.   Dream wear nasal pillow- he is bold and has problems with the rubbery head halo-     08-15-2019 for Dr. Corliss Skains, this time face to face.  The patient had tested positive for severe sleep apnea,  His home sleep test had revealed an overall apnea hypopnea index at 33.7/h and in rem sleep he reached 39/h this is considered a severe degree of obstructive sleep apnea but there was no associated complicating factor or comorbidity such as hypoxemia or cardiac arrhythmias seen.  We decided on auto titration device  and this was set between 6 and 18 cmH2O pressure with 3 cm expiratory pressure relief.  The patient is using a DreamWear nasal cradle.  The patient finds this most comfortable and not easy to dislodge.  His residual AHI is 0.9/h which is an excellent resolution, he has minimal air leaks of 0.5 L/min.  The 95th percentile pressure is only 8 cmH2O which means that I can restrict the maximum pressure window to 12.  Compliance by days is 90% and compliance by over 4 hours of consecutive nightly use is 80%.  Average use at time 4 hours and 48 minutes which is sufficient to fulfill insurance compliance criteria. Review of Systems: Out of a complete 14 system review, the patient complains of only the following symptoms, and all other reviewed systems are negative.:  Fatigue, sleepiness , snoring, fragmented sleep, Insomnia, RLS,   How likely are you to doze in the following situations: 0 = not likely, 1 = slight chance, 2 = moderate chance, 3 = high chance   Sitting and Reading? Watching Television? Sitting inactive in a public place (theater or meeting)? As a passenger in a car for an hour without a break? Lying down in the afternoon when circumstances permit? Sitting and talking to someone? Sitting quietly after lunch without alcohol? In a car, while stopped for a few minutes in traffic?   Total = FSS at 18/ 63 points. ESS: 7/ 24 points.   Social History   Socioeconomic History   Marital status: Married    Spouse name: Not on file   Number of children: Not on file   Years of education: Not on file   Highest education level: Not on file  Occupational History   Not on file  Tobacco Use   Smoking status: Never   Smokeless tobacco: Never  Vaping Use   Vaping status: Never Used  Substance and Sexual Activity   Alcohol use: Yes    Comment: rare   Drug use: No   Sexual activity: Not on file  Other Topics Concern   Not on file  Social History Narrative   Not on file   Social  Determinants of Health   Financial Resource Strain: Not on file  Food Insecurity: Not on file  Transportation Needs: Not on file  Physical Activity: Not on file  Stress: Not on file  Social Connections: Not on file    Family  History  Problem Relation Age of Onset   Colon cancer Maternal Grandmother    Arthritis Mother    Hypertension Father    Diabetes Father    Non-Hodgkin's lymphoma Father    Hypertension Brother    Colon polyps Neg Hx    Esophageal cancer Neg Hx    Rectal cancer Neg Hx    Stomach cancer Neg Hx     Past Medical History:  Diagnosis Date   Diabetes mellitus without complication (HCC)    Epigastric pain    Gout    Headache    Helicobacter pylori gastritis 09/2017   Hx of adenomatous polyp of colon 10/29/2017   Hyperlipidemia    Hypertension    Hypothyroid    Rectal bleeding     Past Surgical History:  Procedure Laterality Date   np past surgeries     UPPER GASTROINTESTINAL ENDOSCOPY       Current Outpatient Medications on File Prior to Visit  Medication Sig Dispense Refill   allopurinol (ZYLOPRIM) 300 MG tablet Take 300 mg by mouth daily.     amLODipine (NORVASC) 5 MG tablet Take 5 mg by mouth daily.     atorvastatin (LIPITOR) 80 MG tablet Take 80 mg by mouth.     ezetimibe (ZETIA) 10 MG tablet Take 10 mg by mouth daily.     levothyroxine (SYNTHROID) 88 MCG tablet Take 88 mcg by mouth daily before breakfast.      metFORMIN (GLUCOPHAGE-XR) 500 MG 24 hr tablet Take 500 mg by mouth in the morning and at bedtime.     nebivolol (BYSTOLIC) 5 MG tablet      Semaglutide, 1 MG/DOSE, (OZEMPIC, 1 MG/DOSE,) 4 MG/3ML SOPN Inject 1 mg into the skin once a week.     No current facility-administered medications on file prior to visit.    Allergies  Allergen Reactions   Ace Inhibitors Other (See Comments)    angioedema     DIAGNOSTIC DATA (LABS, IMAGING, TESTING) - I reviewed patient records, labs, notes, testing and imaging myself where  available.  Lab Results  Component Value Date   WBC 7.8 10/19/2017   HGB 14.3 10/19/2017   HCT 41.6 10/19/2017   MCV 86.1 10/19/2017   PLT 258.0 10/19/2017      Component Value Date/Time   NA 139 10/19/2017 1334   K 3.2 (L) 10/19/2017 1334   CL 97 10/19/2017 1334   CO2 35 (H) 10/19/2017 1334   GLUCOSE 143 (H) 10/19/2017 1334   BUN 22 10/19/2017 1334   CREATININE 1.42 10/19/2017 1334   CALCIUM 9.5 10/19/2017 1334   No results found for: "CHOL", "HDL", "LDLCALC", "LDLDIRECT", "TRIG", "CHOLHDL" No results found for: "HGBA1C" No results found for: "VITAMINB12" No results found for: "TSH"  PHYSICAL EXAM:  Today's Vitals   10/11/23 1431  BP: 125/82  Pulse: 75  Weight: 172 lb (78 kg)  Height: 5\' 8"  (1.727 m)   Body mass index is 26.15 kg/m.   Wt Readings from Last 3 Encounters:  10/11/23 172 lb (78 kg)  09/10/22 164 lb (74.4 kg)  09/10/21 179 lb 8 oz (81.4 kg)     Ht Readings from Last 3 Encounters:  10/11/23 5\' 8"  (1.727 m)  09/10/22 5\' 8"  (1.727 m)  09/10/21 5\' 8"  (1.727 m)      General: The patient is awake, alert and appears not in acute distress. The patient is well groomed. Head: Normocephalic, atraumatic. Neck is supple.  The patient is awake, alert and appears  not in acute distress. The patient is well groomed. Head: Normocephalic, atraumatic. Neck is supple. Mallampati 3  neck circumference:17". Nasal airflow patent , Retrognathia is mild seen. Brachycephaly.  No scalp hair.   No bruxism marks were noted, Cardiovascular:  without distended neck veins. Respiratory: able to hold his breath for 30 seconds or longer. Skin:  Without evidence of facial edema, or rash Trunk: BMI is 27.3. lost 8 pounds.  This is this is a reduction by 8 or 9 pounds.The patient's upper body posture is erect.    Neurologic exam : The patient is awake and alert, oriented to place and time.   Attention span & concentration ability appears normal.  Speech is fluent,  without   dysarthria, dysphonia or aphasia.  Mood and affect are appropriate.   Cranial nerves: No loss of smell or taste.  Pupils are equal . Extraocular movements  in vertical and horizontal planes intact and without nystagmus. Hearing intact.    Facial motor strength is symmetric and tongue and uvula move midline. Shoulder shrug was symmetrical.    Motor exam:  Symmetric, full ROM in upper extremities, neck and shoulder. Coordination: without evidence of ataxia, dysmetria or tremor. 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  Gait and station: reported: Patient walks without assistive device .   Deep tendon reflexes at the patella levels were brisk with a slight increase in left over right activity 2+ versus 1+.  No spasticity is noted, no tremor no abnormal muscle tone,      ASSESSMENT AND PLAN 52 y.o. year old male  here with:  OSA, now reduced to 1.5 / h AHI , no significant airleaks.  High compliance.  Still a shift worker de Scientist, product/process development.  He got used to his mask, uses a sleeve for headgear to prevent chafing, he had an  irritated scalp.  Lost a total of 25  pounds in the last 2 month.  Has slept better and has less EDS-        1)  order for a travel CPAP / order for  Dr Margaree Mackintosh , 95% pressure was 8.4 cm water and I like to order 8 cm with 3 cm EPR and  a ResMed machine to merge all data.  2) there has been some weight loss.  BMI is 26.  Improved metabolic syndrome on Ozempic, lost 25 pounds.      I plan to follow up either personally or through our NP within 12 months.   I would like to thank  Rodrigo Ran, Md [address] [place],  [place] [address] for allowing me to meet with and to take care of this pleasant patient.   CC: I will share my notes with PCP, Dr Waynard Edwards.  After spending a total time of  19  minutes face to face and additional time for physical and neurologic examination, review of laboratory studies,  personal review of imaging studies, reports and results of other  testing and review of referral information / records as far as provided in visit,   Electronically signed by: Melvyn Novas, MD 10/11/2023 2:41 PM  [facility] Board certified by The [employer] of Sleep Medicine and Diplomate of the [employer] of Sleep Medicine. Board certified In Neurology through the ABPN, Fellow of the [employer] of Neu[employer].

## 2023-10-11 NOTE — Patient Instructions (Signed)

## 2023-10-12 DIAGNOSIS — E1169 Type 2 diabetes mellitus with other specified complication: Secondary | ICD-10-CM | POA: Diagnosis not present

## 2023-10-12 DIAGNOSIS — Z23 Encounter for immunization: Secondary | ICD-10-CM | POA: Diagnosis not present

## 2023-12-30 ENCOUNTER — Telehealth: Payer: Self-pay | Admitting: Internal Medicine

## 2023-12-30 DIAGNOSIS — Z860101 Personal history of adenomatous and serrated colon polyps: Secondary | ICD-10-CM

## 2023-12-30 NOTE — Telephone Encounter (Signed)
 Dr. Corliss Skains is ready to schedule a colonoscopy - please contact him and arrange for [facility]  Encounter Diagnosis  Name Primary?   Hx of adenomatous polyp of colon Yes

## 2024-01-05 NOTE — Telephone Encounter (Signed)
 Left message for pt to call back

## 2024-01-06 NOTE — Telephone Encounter (Addendum)
 Patient returned call & has been scheduled for PV on 01/31/24 and colon on 02/16/24 by front desk scheduling.

## 2024-01-07 ENCOUNTER — Encounter: Payer: Self-pay | Admitting: Internal Medicine

## 2024-01-31 ENCOUNTER — Ambulatory Visit (AMBULATORY_SURGERY_CENTER): Payer: BC Managed Care – PPO

## 2024-01-31 VITALS — Ht 68.0 in | Wt 167.0 lb

## 2024-01-31 DIAGNOSIS — Z860100 Personal history of colon polyps, unspecified: Secondary | ICD-10-CM

## 2024-01-31 MED ORDER — SUFLAVE 178.7 G PO SOLR: 1.0000 | ORAL | 0 refills | Status: DC

## 2024-01-31 NOTE — Progress Notes (Signed)
No egg or soy allergy known to patient  No issues known to pt with past sedation with any surgeries or procedures Patient denies ever being told they had issues or difficulty with intubation  No FH of Malignant Hyperthermia Pt is not on diet pills Pt is not on  home 02  Pt is not on blood thinners  Pt has occasional constipation d/t Ozempic.  No A fib or A flutter Have any cardiac testing pending--No Pt can ambulate  Pt denies use of chewing tobacco Discussed diabetic I weight loss medication holds Discussed NSAID holds Checked BMI Pt instructed to use Singlecare.com or GoodRx for a price reduction on prep  Patient's chart reviewed by Cathlyn Parsons CNRA prior to previsit and patient appropriate for the [facility].  Pre visit completed and red dot placed by patient's name on their procedure day (on provider's schedule).

## 2024-02-09 ENCOUNTER — Encounter: Payer: Self-pay | Admitting: Internal Medicine

## 2024-02-16 ENCOUNTER — Telehealth: Payer: Self-pay

## 2024-02-16 ENCOUNTER — Ambulatory Visit (AMBULATORY_SURGERY_CENTER): Payer: BC Managed Care – PPO | Admitting: Internal Medicine

## 2024-02-16 ENCOUNTER — Encounter: Payer: BC Managed Care – PPO | Admitting: Internal Medicine

## 2024-02-16 ENCOUNTER — Encounter: Payer: Self-pay | Admitting: Internal Medicine

## 2024-02-16 VITALS — BP 104/62 | HR 65 | Temp 97.2°F | Resp 19 | Ht 68.0 in | Wt 167.0 lb

## 2024-02-16 DIAGNOSIS — Z860101 Personal history of adenomatous and serrated colon polyps: Secondary | ICD-10-CM

## 2024-02-16 DIAGNOSIS — A048 Other specified bacterial intestinal infections: Secondary | ICD-10-CM

## 2024-02-16 DIAGNOSIS — Z1211 Encounter for screening for malignant neoplasm of colon: Secondary | ICD-10-CM | POA: Diagnosis not present

## 2024-02-16 MED ORDER — SODIUM CHLORIDE 0.9 % IV SOLN: 500.0000 mL | Freq: Once | INTRAVENOUS | Status: DC

## 2024-02-16 NOTE — Telephone Encounter (Signed)
 I will have Dr Leone Payor sign the test order form and then contact Dr Corliss Skains to come get the kit.

## 2024-02-16 NOTE — Telephone Encounter (Signed)
-----   Message from Stan Head sent at 02/16/2024 12:15 PM EST ----- Regarding: h pylori test Dr. Corliss Skains has a hx H pylori in 2018.  We need him to do a [employer] stool antigen test for H pylori to make sure its gone.  Please contact him later this week or next week and arrange that.  Dx is H pylori infection

## 2024-02-16 NOTE — Op Note (Signed)
  [facility] Patient Name: [name] [name] Procedure Date: 02/16/2024 11:43 AM MRN: [id number] Endoscopist: Iva Boop , MD, [id number] Age: 53 Referring MD:  Date of Birth: 08/13/1971 Gender: Male Account #: [id number] Procedure:                Colonoscopy Indications:              Surveillance: Personal history of adenomatous                            polyps on last colonoscopy > 5 years ago, Last                            colonoscopy: 2018 Medicines:                Monitored Anesthesia Care Procedure:                Pre-Anesthesia Assessment:                           - Prior to the procedure, a History and Physical                            was performed, and patient medications and                            allergies were reviewed. The patient's tolerance of                            previous anesthesia was also reviewed. The risks                            and benefits of the procedure and the sedation                            options and risks were discussed with the patient.                            All questions were answered, and informed consent                            was obtained. Prior Anticoagulants: The patient has                            taken no anticoagulant or antiplatelet agents. ASA                            Grade Assessment: II - A patient with mild systemic                            disease. After reviewing the risks and benefits,                            the patient was deemed in satisfactory condition to  undergo the procedure.                           After obtaining informed consent, the colonoscope                            was passed under direct vision. Throughout the                            procedure, the patient's blood pressure, pulse, and                            oxygen saturations were monitored continuously. The                            CF [id number] #[id number] was introduced through the  anus                            and advanced to the the cecum, identified by                            appendiceal orifice and ileocecal valve. The                            colonoscopy was performed without difficulty. The                            patient tolerated the procedure well. The quality                            of the bowel preparation was good. The ileocecal                            valve, appendiceal orifice, and rectum were                            photographed. The bowel preparation used was                            SUFLAVE via split dose instruction. Scope In: 11:52:47 AM Scope Out: 12:05:22 PM Scope Withdrawal Time: 0 hours 10 minutes 17 seconds  Total Procedure Duration: 0 hours 12 minutes 35 seconds  Findings:                 The perianal and digital rectal examinations were                            normal. Pertinent negatives include normal prostate                            (size, shape, and consistency).                           The entire examined colon appeared normal on direct  and retroflexion views. Terminal ileum also normal. Complications:            No immediate complications. Estimated Blood Loss:     Estimated blood loss: none. Impression:               - The entire examined colon and terminal ileum is                            normal on direct and retroflexion views.                           - No specimens collected.                           - Personal history of colonic polyp - 3 mm adenoma                            removed 2018. Recommendation:           - Patient has a contact number available for                            emergencies. The signs and symptoms of potential                            delayed complications were discussed with the                            patient. Return to normal activities tomorrow.                            Written discharge instructions were provided to the                             patient.                           - Resume previous diet.                           - Continue present medications.                           - Repeat colonoscopy in 10 years. Iva Boop, MD 02/16/2024 12:15:00 PM This report has been signed electronically.

## 2024-02-16 NOTE — Progress Notes (Signed)
 [facility] History and Physical   Primary Care Physician:  Rodrigo Ran, MD   Reason for Procedure:    Encounter Diagnosis  Name Primary?   Hx of adenomatous polyp of colon Yes     Plan:    colonoscopy     HPI: [name] [name] is a 53 y.o. male s/p colon polypectomy  10/2017. 3 mm adenoma removed. Here for surveillance exam.  Also has a hx H pylori gastritis Tx w/ Pylera.   Past Medical History:  Diagnosis Date   Diabetes mellitus without complication (HCC)    Epigastric pain    Gout    Headache    Helicobacter pylori gastritis 09/2017   Hx of adenomatous polyp of colon 10/29/2017   Hyperlipidemia    Hypertension    Hypothyroid    Rectal bleeding    Sleep apnea     Past Surgical History:  Procedure Laterality Date   COLONOSCOPY     2018   np past surgeries     UPPER GASTROINTESTINAL ENDOSCOPY      Prior to Admission medications   Medication Sig Start Date End Date Taking? Authorizing Provider  allopurinol (ZYLOPRIM) 300 MG tablet Take 300 mg by mouth daily. 03/10/19  Yes [provider]  amLODipine (NORVASC) 5 MG tablet Take 5 mg by mouth daily. 03/10/19  Yes [provider]  atorvastatin (LIPITOR) 80 MG tablet Take 80 mg by mouth.   Yes [provider]  chlorthalidone (HYGROTON) 25 MG tablet Take 25 mg by mouth daily.   Yes [provider]  ezetimibe (ZETIA) 10 MG tablet Take 10 mg by mouth daily. 03/13/19  Yes [provider]  levothyroxine (SYNTHROID) 88 MCG tablet Take 88 mcg by mouth daily before breakfast.  04/25/19  Yes [provider]  metFORMIN (GLUCOPHAGE-XR) 500 MG 24 hr tablet Take 500 mg by mouth in the morning and at bedtime.   Yes [provider]  nebivolol (BYSTOLIC) 5 MG tablet  09/01/23  Yes [provider]  potassium chloride (KLOR-CON) 10 MEQ tablet Take 10 mEq by mouth daily.   Yes [provider]  Semaglutide, 1 MG/DOSE, (OZEMPIC, 1 MG/DOSE,) 4  MG/3ML SOPN Inject 1 mg into the skin once a week. 06/27/21   [provider]  zolpidem (AMBIEN) 5 MG tablet Take 5 mg by mouth at bedtime as needed. 09/01/23   [provider]    Current Outpatient Medications  Medication Sig Dispense Refill   allopurinol (ZYLOPRIM) 300 MG tablet Take 300 mg by mouth daily.     amLODipine (NORVASC) 5 MG tablet Take 5 mg by mouth daily.     atorvastatin (LIPITOR) 80 MG tablet Take 80 mg by mouth.     chlorthalidone (HYGROTON) 25 MG tablet Take 25 mg by mouth daily.     ezetimibe (ZETIA) 10 MG tablet Take 10 mg by mouth daily.     levothyroxine (SYNTHROID) 88 MCG tablet Take 88 mcg by mouth daily before breakfast.      metFORMIN (GLUCOPHAGE-XR) 500 MG 24 hr tablet Take 500 mg by mouth in the morning and at bedtime.     nebivolol (BYSTOLIC) 5 MG tablet      potassium chloride (KLOR-CON) 10 MEQ tablet Take 10 mEq by mouth daily.     Semaglutide, 1 MG/DOSE, (OZEMPIC, 1 MG/DOSE,) 4 MG/3ML SOPN Inject 1 mg into the skin once a week.     zolpidem (AMBIEN) 5 MG tablet Take 5 mg by mouth at bedtime as  needed.     Current Facility-Administered Medications  Medication Dose Route Frequency Provider Last Rate Last Admin   0.9 %  sodium chloride infusion  500 mL Intravenous Once Iva Boop, MD        Allergies as of 02/16/2024 - Review Complete 02/16/2024  Allergen Reaction Noted   Ace inhibitors Other (See Comments) 11/12/2016    Family History  Problem Relation Age of Onset   Arthritis Mother    Hypertension Father    Diabetes Father    Non-Hodgkin's lymphoma Father    Hypertension Brother    Colon cancer Maternal Grandfather    Colon polyps Neg Hx    Esophageal cancer Neg Hx    Rectal cancer Neg Hx    Stomach cancer Neg Hx     Social History   Socioeconomic History   Marital status: Married    Spouse name: Not on file   Number of children: Not on file   Years of education: Not on file   Highest education level: Not on file   Occupational History   Not on file  Tobacco Use   Smoking status: Never   Smokeless tobacco: Never  Vaping Use   Vaping status: Never Used  Substance and Sexual Activity   Alcohol use: Never   Drug use: No   Sexual activity: Not on file  Other Topics Concern   Not on file  Social History Narrative   Not on file   Social Drivers of Health   Financial Resource Strain: Not on file  Food Insecurity: Not on file  Transportation Needs: Not on file  Physical Activity: Not on file  Stress: Not on file  Social Connections: Not on file  Intimate Partner Violence: Not on file    Review of Systems:  All other review of systems negative except as mentioned in the HPI.  Physical Exam: Vital signs BP 105/65 (BP Location: Right Arm, Patient Position: Sitting, Cuff Size: Normal)   Pulse 64   Temp (!) 97.2 F (36.2 C) (Temporal)   Ht 5\' 8"  (1.727 m)   Wt 167 lb (75.8 kg)   SpO2 98%   BMI 25.39 kg/m   General:   Alert,  Well-developed, well-nourished, pleasant and cooperative in NAD Lungs:  Clear throughout to auscultation.   Heart:  Regular rate and rhythm; no murmurs, clicks, rubs,  or gallops. Abdomen:  Soft, nontender and nondistended. Normal bowel sounds.   Neuro/Psych:  Alert and cooperative. Normal mood and affect. A and O x 3   @Keyetta Hollingworth  Sena Slate, MD, [facility] [phone] (pager) 02/16/2024 11:41 AM@

## 2024-02-16 NOTE — Progress Notes (Signed)
 Vitals-DT  Pt's states no medical or surgical changes since previsit or office visit.

## 2024-02-16 NOTE — Progress Notes (Signed)
 Sedate, gd SR, tolerated procedure well, VSS, report to RN

## 2024-02-16 NOTE — Patient Instructions (Addendum)
 No polyps or cancer were seen - normal colonoscopy.  Next routine colonoscopy or other screening test in 10 years - 2035.  Regarding the H pylori testing - my staff will reach out to you to arrange a stool test to confirm eradication from that (treated in 2018).  I appreciate the opportunity to care for you. Iva Boop, MD, FACG  YOU HAD AN ENDOSCOPIC PROCEDURE TODAY AT THE [facility]:   Refer to the procedure report that was given to you for any specific questions about what was found during the examination.  If the procedure report does not answer your questions, please call your gastroenterologist to clarify.  If you requested that your care partner not be given the details of your procedure findings, then the procedure report has been included in a sealed envelope for you to review at your convenience later.  YOU SHOULD EXPECT: Some feelings of bloating in the abdomen. Passage of more gas than usual.  Walking can help get rid of the air that was put into your GI tract during the procedure and reduce the bloating. If you had a lower endoscopy (such as a colonoscopy or flexible sigmoidoscopy) you may notice spotting of blood in your stool or on the toilet paper. If you underwent a bowel prep for your procedure, you may not have a normal bowel movement for a few days.  Please Note:  You might notice some irritation and congestion in your nose or some drainage.  This is from the oxygen used during your procedure.  There is no need for concern and it should clear up in a day or so.  SYMPTOMS TO REPORT IMMEDIATELY:  Following lower endoscopy (colonoscopy or flexible sigmoidoscopy):  Excessive amounts of blood in the stool  Significant tenderness or worsening of abdominal pains  Swelling of the abdomen that is new, acute  Fever of 100F or higher  For urgent or emergent issues, a gastroenterologist can be reached at any hour by calling [phone]. Do not use MyChart  messaging for urgent concerns.    DIET:  We do recommend a small meal at first, but then you may proceed to your regular diet.  Drink plenty of fluids but you should avoid alcoholic beverages for 24 hours.  ACTIVITY:  You should plan to take it easy for the rest of today and you should NOT DRIVE or use heavy machinery until tomorrow (because of the sedation medicines used during the test).    FOLLOW UP: Our staff will call the number listed on your records the next business day following your procedure.  We will call around 7:15- 8:00 am to check on you and address any questions or concerns that you may have regarding the information given to you following your procedure. If we do not reach you, we will leave a message.     If any biopsies were taken you will be contacted by phone or by letter within the next 1-3 weeks.  Please call us at [phone] if you have not heard about the biopsies in 3 weeks.    SIGNATURES/CONFIDENTIALITY: You and/or your care partner have signed paperwork which will be entered into your electronic medical record.  These signatures attest to the fact that that the information above on your After Visit Summary has been reviewed and is understood.  Full responsibility of the confidentiality of this discharge information lies with you and/or your care-partner.

## 2024-02-17 ENCOUNTER — Other Ambulatory Visit: Payer: Self-pay | Admitting: Internal Medicine

## 2024-02-17 ENCOUNTER — Telehealth: Payer: Self-pay

## 2024-02-17 DIAGNOSIS — A048 Other specified bacterial intestinal infections: Secondary | ICD-10-CM

## 2024-02-17 NOTE — Telephone Encounter (Signed)
 Follow up call to pt, lm for pt to call if having any difficulty with normal activities or eating and drinking.  Also to call if any other questions or concerns.

## 2024-02-17 NOTE — Telephone Encounter (Signed)
 I left Dr Corliss Skains a detailed message that his test kit is up front for pick up and the instructions are inside the bag.

## 2024-03-23 DIAGNOSIS — I10 Essential (primary) hypertension: Secondary | ICD-10-CM | POA: Diagnosis not present

## 2024-03-23 DIAGNOSIS — E1169 Type 2 diabetes mellitus with other specified complication: Secondary | ICD-10-CM | POA: Diagnosis not present

## 2024-05-16 DIAGNOSIS — B078 Other viral warts: Secondary | ICD-10-CM | POA: Diagnosis not present

## 2024-05-16 DIAGNOSIS — L819 Disorder of pigmentation, unspecified: Secondary | ICD-10-CM | POA: Diagnosis not present

## 2024-10-10 ENCOUNTER — Encounter: Payer: Self-pay | Admitting: Neurology

## 2024-10-10 ENCOUNTER — Ambulatory Visit: Payer: BC Managed Care – PPO | Admitting: Neurology

## 2024-10-10 VITALS — BP 118/74 | HR 76 | Ht 68.0 in | Wt 167.2 lb

## 2024-10-10 DIAGNOSIS — G4733 Obstructive sleep apnea (adult) (pediatric): Secondary | ICD-10-CM | POA: Diagnosis not present

## 2024-10-10 DIAGNOSIS — G4726 Circadian rhythm sleep disorder, shift work type: Secondary | ICD-10-CM | POA: Diagnosis not present

## 2024-10-10 NOTE — Patient Instructions (Signed)
 Living With Sleep Apnea Sleep apnea is a condition that affects your breathing while you're sleeping. Your tongue or the tissue in your throat may block the flow of air while you sleep. You may have shallow breathing or stop breathing for short periods of time. The breaks in breathing interrupt the deep sleep that you need to feel rested. Even if you don't wake up from the gaps in breathing, you may feel tired during the day. People with sleep apnea may snore loudly. You may have a headache in the morning and feel anxious or depressed. How can sleep apnea affect me? Sleep apnea increases your chances of being very tired during the day. This is called daytime fatigue. Sleep apnea can also increase your risk of: Heart attack. Stroke. Obesity. Type 2 diabetes. Heart failure. Irregular heartbeat. High blood pressure. If you are very tired during the day, you may be more likely to: Not do well in school or at work. Fall asleep while driving. Have trouble paying attention. Develop depression or anxiety. Have problems having sex. This is called sexual dysfunction. What actions can I take to manage sleep apnea? Sleep apnea treatment  If you were given a device to open your airway while you sleep, use it only as told by your health care provider. You may be given: An oral appliance. This is a mouthpiece that shifts your lower jaw forward. A continuous positive airway pressure (CPAP) device. This blows air through a mask. A nasal expiratory positive airway pressure (EPAP) device. This has valves that you put into each nostril. A bi-level positive airway pressure (BIPAP) device. This blows air through a mask when you breathe in and breathe out. You may need surgery if other treatments don't work for you. Sleep habits Go to sleep and wake up at the same time every day. This helps set your internal clock for sleeping. If you stay up later than usual on weekends, try to get up in the morning within 2  hours of the time you usually wake up. Try to get at least 7-9 hours of sleep each night. Stop using a computer, tablet, and mobile phone a few hours before bedtime. Do not take long naps during the day. If you nap, limit it to 30 minutes. Have a relaxing bedtime routine. Reading or listening to music may relax you and help you sleep. Use your bedroom only for sleep. Keep your television and computer out of your bedroom. Keep your bedroom cool, dark, and quiet. Use a supportive mattress and pillows. Follow your provider's instructions for other changes to sleep habits. Nutrition Do not eat big meals in the evening. Do not have caffeine in the later part of the day. The effects of caffeine can last for more than 5 hours. Follow your provider's instructions for any changes to what you eat and drink. Lifestyle Do not drink alcohol before bedtime. Alcohol can cause you to fall asleep at first, but then it can cause you to wake up in the middle of the night and have trouble getting back to sleep. Do not smoke, vape, or use nicotine or tobacco. Medicines Take over-the-counter and prescription medicines only as told by your provider. Do not use over-the-counter sleep medicine. You may become dependent on this medicine, and it can make sleep apnea worse. Do not take medicines, such as sedatives and narcotics, unless told to by your provider. Activity Exercise on most days, but avoid exercising in the evening. Exercising near bedtime can interfere with sleeping.  If possible, spend time outside every day. Natural light helps with your internal clock. General information Lose weight if you need to. Stay at a healthy weight. If you are having surgery, make sure to tell your provider that you have sleep apnea. You may need to bring your device with you. Keep all follow-up visits. Your provider will want to check on your condition. Where to find more information National Heart, Lung, and Blood  Institute: [employer] This information is not intended to replace advice given to you by your health care provider. Make sure you discuss any questions you have with your health care provider. Document Revised: 04/07/2023 Document Reviewed: 04/07/2023 Elsevier Patient Education  2024 [employer].

## 2024-10-10 NOTE — Progress Notes (Signed)
 [name] [name]

## 2024-10-10 NOTE — Progress Notes (Signed)
 Provider:  Dedra Gores, MD  Primary Care Physician:  Shayne Anes, MD [address] [place] [address]     Referring Provider: Shayne Anes, Md [address] [place],  [place] [address]          Chief Complaint according to patient   Patient presents with:                HISTORY OF PRESENT ILLNESS:  [name] [name] is a 53 y.o. male patient who is here for revisit 10/10/2024 for CPAP in OSA.   Continuous to be a compliant CPAP user, machine is now 55 years old.  He is now commuting to [facility] ,, a new [facility] department.  He sleeps in a hotel weekly.  He is Ok with using the machine for another year if it lasts. I will order a new machine  after HST.      Chief concern according to patient :      [name] [name] is a 53 y.o. male patient who is here for revisit 10/11/2023 for CPAP Compliance in therapy of OSA .    93% daily compliance, 77% by hours. 5 h 58 m.  06- 13 cm water , 3 cm EPR.  Residual AHI is 1.2/h.    95% pressure at 8,4 cm . Air leak: 6.7L/ minutes FSS at 18/ 63 points. ESS: 7/ 24 points.    Excessive daytime sleepiness, witnessed apnea.  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 He has been a complaint CPAP user and has less daytime sleepiness.  Overall his use of CPAP has been without any hiccups, he is stable, his compliance is 90% for days with an AutoSet between 6 and 13 cm water pressure and 3 cm EPR.  The AHI residual is 1.5/h.  90th percentile pressure is 8 cm water.  There are no Cheyne-Stokes respirations identified.  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  This has led to him using the CPAP more often but also he sleeps usually in a different bedroom as not to wake his spouse and the CPAP is not effective.   His Epworth sleepiness score is in normal range at 7 out of 24 points and he is not  excessively fatigued in daytime.   Since I last saw [name] he lost about 25 pounds his current body mass index is 24.9 in clothing here today blood pressure 124/86 mmHg. On Ozempic to combat fatty liver.      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  His EPR level is 3 cmH2O imposed on a minimum pressure of 6 maximum pressure of 13 cmH2O. His on-call duties also have changed recently so there is more sleep at home.  The residual AHI is 1.7 which is an excellent resolution of his baseline he is using the 95th percentile of 8.6 cm water pressure and his air leaks are very low at the 95th percentile of 4.3  L a minute.   Since he has no resurgence of excessive daytime sleepiness or fatigue I think that we do not need to change any of the current settings.  Medication wise there has been no change     Fam Hx : see previous note  Social HX; see previous note       Review of Systems: Out of a complete 14 system review, the patient complains of only the following symptoms, and all other reviewed systems are negative.:   SLEEPINESS ?  How likely are you to doze in the following situations: 0 = not likely, 1 = slight chance, 2 = moderate chance, 3 = high chance  Sitting and Reading? Watching Television? Sitting inactive in a public place (theater or meeting)? Lying down in the afternoon when circumstances permit? Sitting and talking to someone? Sitting quietly after lunch without alcohol ? In a car, while stopped for a few minutes in traffic? As a passenger in a car for an hour without a break?  Total =  7/ 24   FSS 21/ 63       Social  History   Socioeconomic History   Marital status: Married    Spouse name: Not on file   Number of children: Not on file   Years of education: Not on file   Highest education level: Not on file  Occupational History   Not on file  Tobacco Use   Smoking status: Never   Smokeless tobacco: Never  Vaping Use   Vaping status: Never Used  Substance and Sexual Activity   Alcohol  use: Never   Drug use: No   Sexual activity: Not on file  Other Topics Concern   Not on file  Social History Narrative   1 cup of caffeine daily    Social Drivers of Health   Financial Resource Strain: Not on file  Food Insecurity: Not on file  Transportation Needs: Not on file  Physical Activity: Not on file  Stress: Not on file  Social Connections: Not on file    Family History  Problem Relation Age of Onset   Arthritis Mother    Hypertension Father    Diabetes Father    Non-Hodgkin's lymphoma Father    Hypertension Brother    Colon cancer Maternal Grandfather    Colon polyps Neg Hx    Esophageal cancer Neg Hx    Rectal cancer Neg Hx    Stomach cancer Neg Hx     Past Medical History:  Diagnosis Date   Diabetes mellitus without complication (HCC)    Epigastric pain    Gout    Headache    Helicobacter pylori gastritis 09/2017   Hx of adenomatous polyp of colon 10/29/2017   Hyperlipidemia    Hypertension    Hypothyroid    Rectal bleeding    Sleep apnea     Past Surgical History:  Procedure Laterality Date   COLONOSCOPY     2018   np past surgeries     UPPER GASTROINTESTINAL ENDOSCOPY       Current Outpatient Medications on File Prior to Visit  Medication Sig Dispense Refill   allopurinol (ZYLOPRIM) 300 MG tablet Take 300 mg by mouth daily.     amLODipine (NORVASC) 5 MG tablet Take 5 mg by mouth daily.     atorvastatin (LIPITOR) 80 MG tablet Take 80 mg by mouth.     chlorthalidone (HYGROTON) 25 MG tablet Take 25 mg by mouth daily.     ezetimibe (  ZETIA) 10 MG tablet Take 10 mg  by mouth daily.     levothyroxine (SYNTHROID) 88 MCG tablet Take 88 mcg by mouth daily before breakfast.      metFORMIN (GLUCOPHAGE-XR) 500 MG 24 hr tablet Take 500 mg by mouth in the morning and at bedtime.     potassium chloride (KLOR-CON) 10 MEQ tablet Take 10 mEq by mouth daily.     Semaglutide, 1 MG/DOSE, (OZEMPIC, 1 MG/DOSE,) 4 MG/3ML SOPN Inject 1 mg into the skin once a week.     zolpidem (AMBIEN) 5 MG tablet Take 5 mg by mouth at bedtime as needed.     nebivolol (BYSTOLIC) 5 MG tablet  (Patient not taking: Reported on 10/10/2024)     No current facility-administered medications on file prior to visit.    Allergies  Allergen Reactions   Ace Inhibitors Other (See Comments)    angioedema   Losartan Swelling     DIAGNOSTIC DATA (LABS, IMAGING, TESTING) - I reviewed patient records, labs, notes, testing and imaging myself where available.  Lab Results  Component Value Date   WBC 7.8 10/19/2017   HGB 14.3 10/19/2017   HCT 41.6 10/19/2017   MCV 86.1 10/19/2017   PLT 258.0 10/19/2017      Component Value Date/Time   NA 139 10/19/2017 1334   K 3.2 (L) 10/19/2017 1334   CL 97 10/19/2017 1334   CO2 35 (H) 10/19/2017 1334   GLUCOSE 143 (H) 10/19/2017 1334   BUN 22 10/19/2017 1334   CREATININE 1.42 10/19/2017 1334   CALCIUM 9.5 10/19/2017 1334   No results found for: CHOL, HDL, LDLCALC, LDLDIRECT, TRIG, CHOLHDL No results found for: YHAJ8R No results found for: VITAMINB12 No results found for: TSH  PHYSICAL EXAM:  Vitals:   10/10/24 1423  BP: 118/74  Pulse: 76  SpO2: 97%   No data found. Body mass index is 25.42 kg/m.   Wt Readings from Last 3 Encounters:  10/10/24 167 lb 3.2 oz (75.8 kg)  02/16/24 167 lb (75.8 kg)  01/31/24 167 lb (75.8 kg)     Ht Readings from Last 3 Encounters:  10/10/24 5' 8 (1.727 m)  02/16/24 5' 8 (1.727 m)  01/31/24 5' 8 (1.727 m)      General: The patient is awake, alert and appears not in acute distress  and groomed. Head: Normocephalic, atraumatic.  Neck is supple. eneral: The patient is awake, alert and appears not in acute distress. The patient is well groomed. Head: Normocephalic, atraumatic. Neck is supple.  The patient is awake, alert and appears not in acute distress. The patient is well groomed. Head: Normocephalic, atraumatic. Neck is supple. Mallampati 3  neck circumference:17. Nasal airflow patent , Retrognathia is mild seen. Brachycephaly.  No scalp hair.   No bruxism marks were noted, Cardiovascular:  without distended neck veins. Respiratory: able to hold his breath for 30 seconds or longer. Skin:  Without evidence of facial edema, or rash Trunk: BMI is 25. lost 20 pounds.  TThe patient's upper body posture is erect.    Neurologic exam : The patient is awake and alert, oriented to place and time.   Attention span & concentration ability appears normal.  Speech is fluent,  without  dysarthria, dysphonia or aphasia.  Mood and affect are appropriate.   Cranial nerves: No loss of smell or taste.  Pupils are equal . Extraocular movements  in vertical and horizontal planes intact and without nystagmus. Hearing intact.    Facial motor strength  is symmetric and tongue and uvula move midline. Shoulder shrug was symmetrical.    ASSESSMENT AND PLAN :   53 y.o. year old male  here with: OSA on CPAP and  Shift work as Careers adviser, MD.     1) OSA on CPAP, machine now 55 years -old.   2) HST ordered.   3) New machine likely at same settings than the previous one.   4) RV with new machine in 6 months.    I would like to thank Shayne Anes, MD afor allowing me to meet with this pleasant patient.   Sleep Clinic Patients are generally offered input on sleep hygiene, life style changes and how to improve compliance with medical treatment where applicable. Review and reiteration of good sleep hygiene measures is offered to any sleep clinic patient, be it in the first consultation or  with any follow up visits.    Any patient with sleepiness should be cautioned not to drive, work at heights, or operate dangerous or heavy equipment when feeling tired or sleepy.      The patient will be seen in follow-up in the sleep clinic at [facility] for discussion of test results, sleep related symptoms and treatment compliance review, further management strategies, etc.   The referring provider will be notified of the test results.   The patient's condition requires frequent monitoring and adjustments in the treatment plan, reflecting the ongoing complexity of care.  This provider is the continuing focal point for all needed services for this condition.  After spending a total time of  29  minutes face to face and time for  history taking, physical and neurologic examination, review of laboratory studies,  personal review of imaging studies, reports and results of other testing and review of referral information / records as far as provided in visit,   Electronically signed by: Dedra Gores, MD 10/10/2024 2:49 PM  [facility] Board certified by The [employer] of Sleep Medicine and Diplomate of the [employer] of [employer] Medicine. Board certified In Neurology through the ABPN, Fellow of the [employer] of Neu[employer].

## 2024-10-13 DIAGNOSIS — E1169 Type 2 diabetes mellitus with other specified complication: Secondary | ICD-10-CM | POA: Diagnosis not present

## 2024-10-13 DIAGNOSIS — M109 Gout, unspecified: Secondary | ICD-10-CM | POA: Diagnosis not present

## 2024-10-13 DIAGNOSIS — E785 Hyperlipidemia, unspecified: Secondary | ICD-10-CM | POA: Diagnosis not present

## 2024-10-13 DIAGNOSIS — Z125 Encounter for screening for malignant neoplasm of prostate: Secondary | ICD-10-CM | POA: Diagnosis not present

## 2024-10-13 DIAGNOSIS — E039 Hypothyroidism, unspecified: Secondary | ICD-10-CM | POA: Diagnosis not present

## 2024-10-23 DIAGNOSIS — Z0000 Encounter for general adult medical examination without abnormal findings: Secondary | ICD-10-CM | POA: Diagnosis not present

## 2024-10-23 DIAGNOSIS — Z1339 Encounter for screening examination for other mental health and behavioral disorders: Secondary | ICD-10-CM | POA: Diagnosis not present

## 2024-10-23 DIAGNOSIS — E1169 Type 2 diabetes mellitus with other specified complication: Secondary | ICD-10-CM | POA: Diagnosis not present

## 2024-10-23 DIAGNOSIS — I10 Essential (primary) hypertension: Secondary | ICD-10-CM | POA: Diagnosis not present

## 2024-10-23 DIAGNOSIS — Z1331 Encounter for screening for depression: Secondary | ICD-10-CM | POA: Diagnosis not present

## 2024-10-23 DIAGNOSIS — R82998 Other abnormal findings in urine: Secondary | ICD-10-CM | POA: Diagnosis not present

## 2024-11-15 ENCOUNTER — Ambulatory Visit: Admitting: Neurology

## 2024-11-15 DIAGNOSIS — G4726 Circadian rhythm sleep disorder, shift work type: Secondary | ICD-10-CM

## 2024-11-15 DIAGNOSIS — G4733 Obstructive sleep apnea (adult) (pediatric): Secondary | ICD-10-CM

## 2024-11-16 NOTE — Progress Notes (Unsigned)
 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    93% daily compliance, 77% by hours. 5 h 58 m.  06- 13 cm water , 3 cm EPR.  Residual AHI is 1.2/h.    95% pressure at 8,4 cm . Air leak: 6.7L/ minutes  FSS at 18/ 63 points. ESS: 7/ 24 points.  OSA, residual AHI now reduced to 1.5 / h , no significant airleaks.  High compliance.  Still a shift worker de scientist, product/process development.  He got used to his mask, uses a sleeve for headgear to prevent chafing, he had an irritated scalp.  Lost a total of 25  pounds in the last 2 month.  Has slept better and has less EDS- check for baseline. Machine was issued in 09-2019 .        1)  order for a travel CPAP / order for  [name] [name] , 95% pressure was 8.4 cm water and I like to order 8 cm with 3 cm EPR and  a ResMed machine to merge all data.   2) there has been some weight loss.  BMI is 26.  Improved metabolic syndrome on Ozempic, lost 25 pounds.       Excessive daytime sleepiness, witnessed apnea.  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  Overall his use of CPAP has been without any hiccups, he is stable, his compliance is 90% for days with an AutoSet between 6 and 13 cm water pressure and 3 cm EPR.  The AHI residual is 1.5/h.  90th percentile pressure is 8 cm water.      Epworth sleepiness score: FSS at 18/ 63 points. ESS: 7/ 24 points.    BMI: 26. 1 kg/m   Neck Circumference: 17   FINDINGS:   Sleep Summary:   Total Recording Time (hours, min):  7 h 11 minutes    Total Sleep Time (hours, min):    6 h 16 minutes              Percent REM (%): 25%                                        Respiratory Indices:   Calculated pAHI (per AASM guideline): 24.8/h                        REM pAHI:        33/h                                         NREM pAHI:     23/h                         Positional AHI:  most sleep time was recorded in supine sleep:  AHI was 29/h  Right lateral sleep was associated with an AHI of only 3/h ! No REM sleep was seen during the brief time in right -lateral -positional sleep.   Snoring:   41 dB mean volume ( mild snoring) 44 % of total sleep time.                                              Oxygen Saturation Statistics:   Oxygen Saturation (%) Mean:   95% , between 78 and 99%.                                       O2 Saturation (minutes) <89%:  5.3 minutes          Pulse Rate Statistics:   Pulse Mean (bpm):    65 bpm, between  55 and 117 bpm.                         IMPRESSION:  This HST confirms the presence of  moderate - severe sleep apnea, obstructive sleep apnea with an AHI of just under 30/h, and with a strong positional component ( supine sleep ).   No clinically significant hypoxia was present , no REM sleep dependent apnea form , no central apneas.   CPAP therapy to be continued. BMi is now 27, weight loss alone will not improve apnea that much, but avoiding supine sleep can help.    RECOMMENDATION: New machine ordered.  My order for an autotitration capable ResMEd CPAP machine will be send to a DME of [name]. Moss choice. The pressure window will be 6-16 cm water with 3 cm EPR, heated humidity and interface of the patient's choice.    Any patient should be cautioned not to drive, work at heights, or operate dangerous or heavy equipment when tired or sleepy.   Review of good sleep hygiene measures is accessible to any sleep clinic patient and can be reiterated through online material- I we recommend the Guide to better Sleep   by the NIH.    Weight loss and Core Strength improvement is highly recommended for individuals with low muscle tone and/ or a BMI over 30.  Any CPAP patient should be reminded to be fully compliant with PAP therapy , (defined as using PAP therapy for more than 4 hours each night ) with the goal to improve sleep related symptoms and decrease long term cardiovascular risks. Any PAP therapy patient should be reminded, that it may take up to 3 months to get fully used to using PAP and it may take 1-2 weeks for an established CPAP user to acclimatize to changes in pressure or mask. The earlier full compliance is achieved, the better long term compliance tends to be.   Please note that untreated obstructive sleep apnea may carry additional perioperative morbidity. Patients with significant obstructive sleep apnea should receive perioperative PAP therapy and the surgical team should be informed of the diagnosis and degree of sleep disordered breathing.  Sleep fragmentation in the presence of normal proportional sleep stages is a nonspecific findings and per se does not signify an intrinsic sleep disorder or a cause for the patient's sleep-related symptoms.  Causes include (but  are not limited to) the unfamiliarity of sleeping while recorded by HST device or sleeping in a sleep lab for a full Polysomnography sleep study, but also circadian rhythm disturbances, medication side effects or an underlying mood disorder or medical problem.   The referring physician will be notified of the test results.       INTERPRETING PHYSICIAN:   Dedra Gores, MD  [facility] and [facility] Sleep Board certified by The [employer] of Sleep Medicine and Diplomate of the [employer] of Sleep Medicine. Board certified In Neurology through the ABPN, Fellow of the [employer] Neu[employer].

## 2024-11-27 ENCOUNTER — Ambulatory Visit: Payer: Self-pay | Admitting: Neurology

## 2024-11-27 DIAGNOSIS — G4726 Circadian rhythm sleep disorder, shift work type: Secondary | ICD-10-CM | POA: Insufficient documentation

## 2024-11-27 DIAGNOSIS — G4733 Obstructive sleep apnea (adult) (pediatric): Secondary | ICD-10-CM | POA: Insufficient documentation

## 2024-11-27 NOTE — Procedures (Signed)
 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    93% daily compliance, 77% by hours. 5 h 58 m.  06- 13 cm water , 3 cm EPR.  Residual AHI is 1[name]2/h.    95% pressure at 8,4 cm . Air leak: 6[name]7L/ minutes   FSS at 18/ 63 points. ESS: 7/ 24 points.   OSA, residual AHI now reduced to 1[name]5 / h , no significant airleaks.  High compliance.  Still a shift worker de scientist, product/process development.  He got used to his mask, uses a sleeve for headgear to prevent chafing, he had an irritated scalp.  Lost a total of 25  pounds in the last 2 month.  Has slept better and has less EDS- check for baseline. Machine was issued in 09-2019 .        1)  order for a travel CPAP / order for  [name] [name] , 95% pressure was 8[name]4 cm water and I like to order 8 cm with 3 cm EPR and  a ResMed machine to merge all data.   2) there has been some weight loss.  BMI is 26.  Improved metabolic syndrome on Ozempic, lost 25 pounds.       Excessive daytime sleepiness, witnessed apnea.  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  Overall his use of CPAP has been without any hiccups, he is stable, his compliance is 90% for days with an AutoSet between 6 and 13 cm water pressure and 3 cm EPR.  The AHI residual is 1[name]5/h.  90th percentile pressure is 8 cm water.       Epworth sleepiness score: FSS at 18/ 63 points. ESS: 7/ 24 points.    BMI: 26. 1 kg/m   Neck Circumference: 17   FINDINGS:   Sleep Summary:   Total Recording Time (hours, min):  7 h 11 minutes    Total Sleep Time (hours, min):   6  h 16 minutes              Percent REM (%): 25%                                        Respiratory Indices:   Calculated pAHI (per AASM guideline): 24[name]8/h                        REM pAHI:        33/h                                         NREM pAHI:     23/h                         Positional AHI:  most sleep time was recorded in supine sleep:  AHI was 29/h  Right lateral sleep was associated with an AHI of only 3/h ! No REM sleep was seen during the brief time in right -lateral -positional sleep.    Snoring:   41 dB mean volume ( mild snoring) 44 % of total sleep time.                                              Oxygen Saturation Statistics:   Oxygen Saturation (%) Mean:   95% , between 78 and 99%.                                       O2 Saturation (minutes) <89%:  5[name]3 minutes          Pulse Rate Statistics:   Pulse Mean (bpm):    65 bpm, between  55 and 117 bpm.                         IMPRESSION:  This HST confirms the presence of  moderate - severe sleep apnea, obstructive sleep apnea with a strong positional component.  No clinically significant hypoxia was present , no REM sleep dependent apnea form , no central apneas.    CPAP therapy to be continued. BMi is now 27, weight loss alone will not improve apnea that much, but avoiding supine sleep can help.    RECOMMENDATION: New machine ordered.  My order for an autotitration capable ResMEd CPAP machine will be send to a DME of [name] [name] [name]. The pressure window will be 6-16 cm water with 3 cm EPR, heated humidity and interface of the patient's [name].      Any patient should be cautioned not to drive, work at heights, or operate dangerous or heavy equipment when tired or sleepy.    Review of good sleep hygiene measures is accessible to any sleep clinic patient and can be reiterated through online material- I we recommend the Guide to better Sleep   by the NIH.    Weight loss and Core Strength improvement is  highly recommended for individuals with low muscle tone and/ or a BMI over 30.  Any CPAP patient should be reminded to be fully compliant with PAP therapy , (defined as using PAP therapy for more than 4 hours each night ) with the goal to improve sleep related symptoms and decrease long term cardiovascular risks. Any PAP therapy patient should be reminded, that it may take up to 3 months to get fully used to using PAP and it may take 1-2 weeks for an established CPAP user to acclimatize to changes in pressure or mask. The earlier full compliance is achieved, the better long term compliance tends to be.    Please note that untreated obstructive sleep apnea may carry additional perioperative morbidity. Patients with significant obstructive sleep apnea should receive perioperative PAP therapy and the surgical team should be informed of the diagnosis and degree of sleep disordered breathing.  Sleep fragmentation in the presence of normal proportional sleep stages is a nonspecific findings and per se does not signify an intrinsic sleep disorder or a cause for the patient's sleep-related symptoms.  Causes include (but are not limited to) the unfamiliarity  of sleeping while recorded by HST device or sleeping in a sleep lab for a full Polysomnography sleep study, but also circadian rhythm disturbances, medication side effects or an underlying mood disorder or medical problem.    The referring physician will be notified of the test results.          INTERPRETING PHYSICIAN:    [name] Gores, MD  [facility] Sleep Board certified by The [employer] of Sleep Medicine and Diplomate of the [employer] Sleep Medicine. Board certified In Neurology through the ABPN, Fellow of the [employer].

## 2024-11-28 NOTE — Progress Notes (Signed)
 Community message has been sent to [employer] for pressure and supplies on 11/28/24. DD

## 2024-12-01 NOTE — Telephone Encounter (Signed)
 Thom Ollinger D, CMA  Joylene, Bradley; Ziegler, Melissa; Tucker, Dolanda; Cain, [place] New orders have been placed for the above pt, DOB: Aug 18, 2071 Thanks

## 2024-12-04 ENCOUNTER — Ambulatory Visit: Admitting: Physician Assistant

## 2024-12-04 ENCOUNTER — Encounter: Payer: Self-pay | Admitting: Physician Assistant

## 2024-12-04 ENCOUNTER — Other Ambulatory Visit: Payer: Self-pay

## 2024-12-04 DIAGNOSIS — R2231 Localized swelling, mass and lump, right upper limb: Secondary | ICD-10-CM | POA: Diagnosis not present

## 2024-12-04 NOTE — Progress Notes (Signed)
 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  He is right-hand dominant.  He notes that swelling about the finger has become more prominent over the last month and the mass swelling has grown quickly over the last month.  Has had no known injury to the hand.  He denies any triggering.  Denies any fevers chills or weight changes.  Mass is starting to bother him and surgery if he has any type of instrumentation laying across that area.  Review systems: See HPI otherwise negative  Physical exam: General Well-developed well-nourished male no acute distress mood and affect appropriate. Respirations: Unlabored Psych: Alert and orient x 3 Bilateral hands: Has obvious mass and swelling over the dorsal ulnar aspect of the ring finger just distal to the PIP joint.  Has full range of motion of bilateral hands.  There is no active triggering.  Slight discomfort with palpation over the fourth finger middle phalanx.  Mass over the ulnar dorsal aspect of the fourth finger is palpable and somewhat oblong.  There is a slight yellowish discoloration to the fourth finger compared to the fingers on both hands.   Radiographs: Right fourth finger multiple views shows soft tissue swelling over the dorsal ulnar aspect of the fourth finger.  No bony abnormalities or lesions.  About the 4th finger  PIP joint there is some soft tissue densities , but this is also seen about the middle finger PIP joint area on the AP view.  No significant arthritic changes throughout the right fourth finger on all 3 views.  Impression: Soft tissue mass right ring finger  Plan: Given the fact that the mass is growing quickly recommend MRI with and without contrast to evaluate the mass.  Will have him follow-up with us  after the MRI to go over results discuss further treatment.  Questions were encouraged and answered by Dr. Vernetta myself.

## 2024-12-05 ENCOUNTER — Other Ambulatory Visit: Payer: Self-pay | Admitting: Radiology

## 2025-01-01 ENCOUNTER — Encounter: Payer: Self-pay | Admitting: Orthopedic Surgery

## 2025-01-04 ENCOUNTER — Ambulatory Visit
Admission: RE | Admit: 2025-01-04 | Discharge: 2025-01-04 | Disposition: A | Discharge status: Home / Self Care | Source: Ambulatory Visit | Attending: Physician Assistant | Admitting: Physician Assistant

## 2025-01-04 DIAGNOSIS — R2231 Localized swelling, mass and lump, right upper limb: Secondary | ICD-10-CM

## 2025-01-04 MED ORDER — GADOPICLENOL 0.5 MMOL/ML IV SOLN
7.5000 mL | Freq: Once | INTRAVENOUS | Status: AC | PRN
  Administered 2025-01-04: 7.5 mL via INTRAVENOUS
